# Patient Record
Sex: Female | Born: 1975 | Race: White | Hispanic: No | Marital: Married | Smoking: Former smoker
Health system: Southern US, Community
[De-identification: ages and names within clinical notes are randomized; demographics above are authoritative.]

## PROBLEM LIST (undated history)

## (undated) DIAGNOSIS — F419 Anxiety disorder, unspecified: Secondary | ICD-10-CM

## (undated) DIAGNOSIS — F329 Major depressive disorder, single episode, unspecified: Secondary | ICD-10-CM

## (undated) DIAGNOSIS — Z720 Tobacco use: Secondary | ICD-10-CM

## (undated) DIAGNOSIS — M549 Dorsalgia, unspecified: Secondary | ICD-10-CM

## (undated) DIAGNOSIS — F1510 Other stimulant abuse, uncomplicated: Secondary | ICD-10-CM

## (undated) DIAGNOSIS — F1120 Opioid dependence, uncomplicated: Secondary | ICD-10-CM

## (undated) DIAGNOSIS — K219 Gastro-esophageal reflux disease without esophagitis: Secondary | ICD-10-CM

## (undated) HISTORY — PX: LEG SURGERY: SHX1003

## (undated) HISTORY — PX: TUBAL LIGATION: SHX77

---

## 2009-06-05 ENCOUNTER — Emergency Department (HOSPITAL_COMMUNITY): Admission: EM | Admit: 2009-06-05 | Discharge: 2009-06-05 | Payer: Self-pay | Admitting: Emergency Medicine

## 2010-07-23 ENCOUNTER — Ambulatory Visit (HOSPITAL_COMMUNITY): Admission: RE | Admit: 2010-07-23 | Discharge: 2010-07-23 | Payer: Self-pay | Admitting: Obstetrics and Gynecology

## 2010-08-25 ENCOUNTER — Emergency Department (HOSPITAL_COMMUNITY): Admission: EM | Admit: 2010-08-25 | Discharge: 2010-08-26 | Payer: Self-pay | Admitting: Emergency Medicine

## 2011-01-16 LAB — CBC
HCT: 35.5 % — ABNORMAL LOW (ref 36.0–46.0)
MCV: 95.4 fL (ref 78.0–100.0)
RDW: 13.4 % (ref 11.5–15.5)
WBC: 9.4 10*3/uL (ref 4.0–10.5)

## 2011-01-16 LAB — POCT PREGNANCY, URINE: Preg Test, Ur: POSITIVE

## 2011-01-16 LAB — COMPREHENSIVE METABOLIC PANEL
Alkaline Phosphatase: 56 U/L (ref 39–117)
BUN: 6 mg/dL (ref 6–23)
GFR calc non Af Amer: >60 mL/min (ref >60)
Glucose, Bld: 93 mg/dL (ref 70–99)
Potassium: 3.8 mEq/L (ref 3.5–5.1)
Total Bilirubin: 0.6 mg/dL (ref 0.3–1.2)
Total Protein: 6.4 g/dL (ref 6.0–8.3)

## 2011-01-16 LAB — RAPID URINE DRUG SCREEN, HOSP PERFORMED
Barbiturates: NOT DETECTED
Benzodiazepines: POSITIVE — AB
Cocaine: NOT DETECTED

## 2011-01-16 LAB — DIFFERENTIAL
Basophils Absolute: 0 10*3/uL (ref 0.0–0.1)
Basophils Relative: 0 % (ref 0–1)
Monocytes Relative: 7 % (ref 3–12)
Neutro Abs: 6.6 10*3/uL (ref 1.7–7.7)
Neutrophils Relative %: 70 % (ref 43–77)

## 2011-10-17 DIAGNOSIS — F419 Anxiety disorder, unspecified: Secondary | ICD-10-CM | POA: Insufficient documentation

## 2011-12-29 ENCOUNTER — Emergency Department (HOSPITAL_BASED_OUTPATIENT_CLINIC_OR_DEPARTMENT_OTHER)
Admission: EM | Admit: 2011-12-29 | Discharge: 2011-12-29 | Disposition: A | Discharge status: Home / Self Care | Payer: Medicaid Other | Attending: Emergency Medicine | Admitting: Emergency Medicine

## 2011-12-29 ENCOUNTER — Encounter (HOSPITAL_BASED_OUTPATIENT_CLINIC_OR_DEPARTMENT_OTHER): Payer: Self-pay | Admitting: *Deleted

## 2011-12-29 DIAGNOSIS — Y93E1 Activity, personal bathing and showering: Secondary | ICD-10-CM | POA: Insufficient documentation

## 2011-12-29 DIAGNOSIS — F17200 Nicotine dependence, unspecified, uncomplicated: Secondary | ICD-10-CM | POA: Insufficient documentation

## 2011-12-29 DIAGNOSIS — S20229A Contusion of unspecified back wall of thorax, initial encounter: Secondary | ICD-10-CM | POA: Insufficient documentation

## 2011-12-29 DIAGNOSIS — K219 Gastro-esophageal reflux disease without esophagitis: Secondary | ICD-10-CM | POA: Insufficient documentation

## 2011-12-29 DIAGNOSIS — Y92009 Unspecified place in unspecified non-institutional (private) residence as the place of occurrence of the external cause: Secondary | ICD-10-CM | POA: Insufficient documentation

## 2011-12-29 DIAGNOSIS — M549 Dorsalgia, unspecified: Secondary | ICD-10-CM

## 2011-12-29 DIAGNOSIS — W010XXA Fall on same level from slipping, tripping and stumbling without subsequent striking against object, initial encounter: Secondary | ICD-10-CM | POA: Insufficient documentation

## 2011-12-29 DIAGNOSIS — T148XXA Other injury of unspecified body region, initial encounter: Secondary | ICD-10-CM

## 2011-12-29 DIAGNOSIS — S300XXA Contusion of lower back and pelvis, initial encounter: Secondary | ICD-10-CM | POA: Insufficient documentation

## 2011-12-29 HISTORY — DX: Gastro-esophageal reflux disease without esophagitis: K21.9

## 2011-12-29 HISTORY — DX: Anxiety disorder, unspecified: F41.9

## 2011-12-29 MED ORDER — HYDROCODONE-ACETAMINOPHEN 5-500 MG PO TABS: 1.0000 | ORAL_TABLET | Freq: Four times a day (QID) | ORAL | Status: AC | PRN

## 2011-12-29 NOTE — ED Provider Notes (Signed)
Medical screening examination/treatment/procedure(s) were performed by non-physician practitioner and as supervising physician I was immediately available for consultation/collaboration.  Gerhard Munch, MD 12/29/11 2227

## 2011-12-29 NOTE — Discharge Instructions (Signed)
Back Pain, Adult Low back pain is very common. About 1 in 5 people have back pain.The cause of low back pain is rarely dangerous. The pain often gets better over time.About half of people with a sudden onset of back pain feel better in just 2 weeks. About 8 in 10 people feel better by 6 weeks.  CAUSES Some common causes of back pain include:  Strain of the muscles or ligaments supporting the spine.   Wear and tear (degeneration) of the spinal discs.   Arthritis.   Direct injury to the back.  DIAGNOSIS Most of the time, the direct cause of low back pain is not known.However, back pain can be treated effectively even when the exact cause of the pain is unknown.Answering your caregiver's questions about your overall health and symptoms is one of the most accurate ways to make sure the cause of your pain is not dangerous. If your caregiver needs more information, he or she may order lab work or imaging tests (X-rays or MRIs).However, even if imaging tests show changes in your back, this usually does not require surgery. HOME CARE INSTRUCTIONS For many people, back pain returns.Since low back pain is rarely dangerous, it is often a condition that people can learn to manageon their own.   Remain active. It is stressful on the back to sit or stand in one place. Do not sit, drive, or stand in one place for more than 30 minutes at a time. Take short walks on level surfaces as soon as pain allows.Try to increase the length of time you walk each day.   Do not stay in bed.Resting more than 1 or 2 days can delay your recovery.   Do not avoid exercise or work.Your body is made to move.It is not dangerous to be active, even though your back may hurt.Your back will likely heal faster if you return to being active before your pain is gone.   Pay attention to your body when you bend and lift. Many people have less discomfortwhen lifting if they bend their knees, keep the load close to their  bodies,and avoid twisting. Often, the most comfortable positions are those that put less stress on your recovering back.   Find a comfortable position to sleep. Use a firm mattress and lie on your side with your knees slightly bent. If you lie on your back, put a pillow under your knees.   Only take over-the-counter or prescription medicines as directed by your caregiver. Over-the-counter medicines to reduce pain and inflammation are often the most helpful.Your caregiver may prescribe muscle relaxant drugs.These medicines help dull your pain so you can more quickly return to your normal activities and healthy exercise.   Put ice on the injured area.   Put ice in a plastic bag.   Place a towel between your skin and the bag.   Leave the ice on for 15 to 20 minutes, 3 to 4 times a day for the first 2 to 3 days. After that, ice and heat may be alternated to reduce pain and spasms.   Ask your caregiver about trying back exercises and gentle massage. This may be of some benefit.   Avoid feeling anxious or stressed.Stress increases muscle tension and can worsen back pain.It is important to recognize when you are anxious or stressed and learn ways to manage it.Exercise is a great option.  SEEK MEDICAL CARE IF:  You have pain that is not relieved with rest or medicine.   You have   pain that does not improve in 1 week.   You have new symptoms.   You are generally not feeling well.  SEEK IMMEDIATE MEDICAL CARE IF:   You have pain that radiates from your back into your legs.   You develop new bowel or bladder control problems.   You have unusual weakness or numbness in your arms or legs.   You develop nausea or vomiting.   You develop abdominal pain.   You feel faint.  Document Released: 10/21/2005 Document Revised: 07/03/2011 Document Reviewed: 03/11/2011 [facility] Patient Information 2012 [place], [place].Contusion A contusion is a deep bruise. Bruises happen when an injury causes  bleeding under the skin. Signs of bruising include pain, puffiness (swelling), and discolored skin. The bruise may turn blue, purple, or yellow. HOME CARE   Rest the injured area until the pain and puffiness are better.   Try to limit use of the injured area as much as possible or as told by your doctor.   Put ice on the injured area.   Put ice in a plastic bag.   Place a towel between your skin and the bag.   Leave the ice on for 15 to 20 minutes, 3 to 4 times a day.   Raise (elevate) the injured area above the level of the heart.   Use an elastic bandage to lessen puffiness and motion.   Only take medicine as told by your doctor.   Eat healthy.   See your doctor for a follow-up visit.  GET HELP RIGHT AWAY IF:   There is more redness, puffiness, or pain.   You have a headache, muscle ache, or you feel dizzy and ill.   You have a fever.   The pain is not controlled with medicine.   The bruise is not getting better.   There is yellowish white fluid (pus) coming from the wound.   You lose feeling (numbness) in the injured area.   The bruised area feels cold.   There are new problems.  MAKE SURE YOU:   Understand these instructions.   Will watch your condition.   Will get help right away if you are not doing well or get worse.  Document Released: 04/08/2008 Document Revised: 07/03/2011 Document Reviewed: 04/08/2008 [facility] Patient Information 2012 [place], [place].

## 2011-12-29 NOTE — ED Notes (Signed)
Pt presents to ED today with back pain for over a year after jumping on trampoline.  Pt has a significant bruise to right buttocks that she noticed 3 days ago

## 2011-12-29 NOTE — ED Provider Notes (Signed)
History     CSN: [id number]  Arrival date & time 12/29/11  1911   First MD Initiated Contact with Patient 12/29/11 2016      Chief Complaint  Patient presents with  . Back Pain    (Consider location/radiation/quality/duration/timing/severity/associated sxs/prior treatment) HPI Comments: Pt states that she has been being treated by her pcp for back pain over the last year:pt states that she fell in the shower a couple of days ago and now she has bruising to her right buttock and she is more sore then normal:pt states that she ran out of her pain medication this morning  Patient is a 36 y.o. female presenting with back pain. The history is provided by the patient. No language interpreter was used.  Back Pain  This is a chronic problem. The current episode started more than 1 week ago. The problem occurs constantly. The problem has been gradually worsening. The pain is associated with falling. The pain is present in the gluteal region. The quality of the pain is described as aching. The pain does not radiate. The pain is moderate. The symptoms are aggravated by twisting and bending. The pain is the same all the time. Pertinent negatives include no numbness, no bowel incontinence, no perianal numbness, no tingling and no weakness.    Past Medical History  Diagnosis Date  . GERD (gastroesophageal reflux disease)   . Anxiety     History reviewed. No pertinent past surgical history.  History reviewed. No pertinent family history.  History  Substance Use Topics  . Smoking status: Current Everyday Smoker -- 1.0 packs/day  . Smokeless tobacco: Not on file  . Alcohol Use: No    OB History    Grav Para Term Preterm Abortions TAB SAB Ect Mult Living                  Review of Systems  Gastrointestinal: Negative for bowel incontinence.  Musculoskeletal: Positive for back pain.  Neurological: Negative for tingling, weakness and numbness.  All other systems reviewed and are  negative.    Allergies  Ultram  Home Medications   Current Outpatient Rx  Name Route Sig Dispense Refill  . CLONAZEPAM 1 MG PO TABS Oral Take 1 mg by mouth 2 (two) times daily as needed. For anxiety    . HYDROCODONE-ACETAMINOPHEN 7.5-325 MG PO TABS Oral Take 1 tablet by mouth every 6 (six) hours as needed. For pain    . IBUPROFEN 200 MG PO TABS Oral Take 400 mg by mouth every 6 (six) hours as needed. For pain    . OMEPRAZOLE 20 MG PO CPDR Oral Take 40 mg by mouth daily.    Marland Kitchen HYDROCODONE-ACETAMINOPHEN 5-500 MG PO TABS Oral Take 1-2 tablets by mouth every 6 (six) hours as needed for pain. 15 tablet 0    BP 138/79  Pulse 97  Temp(Src) 98.3 F (36.8 C) (Oral)  Resp 20  SpO2 100%  Physical Exam  Nursing note and vitals reviewed. Constitutional: She is oriented to person, place, and time. She appears well-developed and well-nourished.  HENT:  Head: Normocephalic and atraumatic.  Eyes: EOM are normal.  Neck: Neck supple.  Cardiovascular: Normal rate and regular rhythm.   Pulmonary/Chest: Effort normal and breath sounds normal.  Musculoskeletal: Normal range of motion.       Cervical back: Normal.       Thoracic back: Normal.       Lumbar back: Normal. She exhibits no bony tenderness, no edema and no  deformity.  Neurological: She is alert and oriented to person, place, and time.  Skin:       Pt has large bruise noted to the right buttock    ED Course  Procedures (including critical care time)  Labs Reviewed - No data to display No results found.   1. Contusion   2. Back pain       MDM  Pt not having any neuro deficit:will treat symptomatically with something for pain:don't think imaging is needed at this time       Teressa Lower, NP 12/29/11 2047

## 2012-01-13 ENCOUNTER — Encounter (HOSPITAL_BASED_OUTPATIENT_CLINIC_OR_DEPARTMENT_OTHER): Payer: Self-pay | Admitting: Family Medicine

## 2012-01-13 ENCOUNTER — Emergency Department (HOSPITAL_BASED_OUTPATIENT_CLINIC_OR_DEPARTMENT_OTHER)
Admission: EM | Admit: 2012-01-13 | Discharge: 2012-01-13 | Disposition: A | Discharge status: Home Health | Payer: Medicaid Other | Attending: Emergency Medicine | Admitting: Emergency Medicine

## 2012-01-13 DIAGNOSIS — F411 Generalized anxiety disorder: Secondary | ICD-10-CM | POA: Insufficient documentation

## 2012-01-13 DIAGNOSIS — M545 Low back pain, unspecified: Secondary | ICD-10-CM | POA: Insufficient documentation

## 2012-01-13 DIAGNOSIS — K219 Gastro-esophageal reflux disease without esophagitis: Secondary | ICD-10-CM | POA: Insufficient documentation

## 2012-01-13 DIAGNOSIS — F17200 Nicotine dependence, unspecified, uncomplicated: Secondary | ICD-10-CM | POA: Insufficient documentation

## 2012-01-13 DIAGNOSIS — M549 Dorsalgia, unspecified: Secondary | ICD-10-CM

## 2012-01-13 HISTORY — DX: Dorsalgia, unspecified: M54.9

## 2012-01-13 MED ORDER — HYDROCODONE-ACETAMINOPHEN 5-325 MG PO TABS: ORAL_TABLET | ORAL | Status: AC

## 2012-01-13 NOTE — ED Notes (Signed)
Pt c/o low back pain chronic in nature and "flared up" post fall 2 wks ago. Pt sts her PCP has changed and [place]. Pt sts ibuprofen is not helping.

## 2012-01-13 NOTE — ED Provider Notes (Signed)
History     CSN: [id number]  Arrival date & time 01/13/12  4098   First MD Initiated Contact with Patient 01/13/12 [phone]      Chief Complaint  Patient presents with  . Back Pain    (Consider location/radiation/quality/duration/timing/severity/associated sxs/prior treatment) HPI Comments: Patient c/o increasing pain to her lower back for 2 weeks.  States she has hx of chronic low back pain and recently ran  Out of her pain medication.  States she contacted her PMD but he is out of the office.  States she has tried taking ibuprofen w/o improvement of the pain.  She denies perineal numbness, incontinence, or dysuria.    Patient is a 36 y.o. female presenting with back pain. The history is provided by the patient. No language interpreter was used.  Back Pain  This is a chronic problem. The current episode started more than 1 week ago. The problem occurs constantly. The problem has been gradually worsening. The pain is associated with twisting and falling. The pain is present in the lumbar spine. The quality of the pain is described as aching. The pain does not radiate. The pain is mild. The symptoms are aggravated by bending, twisting and certain positions. The pain is the same all the time. Pertinent negatives include no chest pain, no fever, no numbness, no abdominal pain, no abdominal swelling, no bowel incontinence, no perianal numbness, no bladder incontinence, no dysuria, no pelvic pain, no leg pain, no paresthesias, no paresis, no tingling and no weakness. She has tried NSAIDs for the symptoms. The treatment provided no relief.    Past Medical History  Diagnosis Date  . GERD (gastroesophageal reflux disease)   . Anxiety   . Back pain     History reviewed. No pertinent past surgical history.  History reviewed. No pertinent family history.  History  Substance Use Topics  . Smoking status: Current Everyday Smoker -- 1.0 packs/day  . Smokeless tobacco: Not on file  . Alcohol Use:  No    OB History    Grav Para Term Preterm Abortions TAB SAB Ect Mult Living                  Review of Systems  Constitutional: Negative for fever.  Cardiovascular: Negative for chest pain.  Gastrointestinal: Negative for abdominal pain and bowel incontinence.  Genitourinary: Negative for bladder incontinence, dysuria, hematuria, decreased urine volume, difficulty urinating, vaginal pain and pelvic pain.  Musculoskeletal: Positive for back pain.  Skin: Negative.   Neurological: Negative for tingling, weakness, numbness and paresthesias.  All other systems reviewed and are negative.    Allergies  Ultram  Home Medications   Current Outpatient Rx  Name Route Sig Dispense Refill  . CLONAZEPAM 1 MG PO TABS Oral Take 1 mg by mouth 2 (two) times daily as needed. For anxiety    . HYDROCODONE-ACETAMINOPHEN 7.5-325 MG PO TABS Oral Take 1 tablet by mouth every 6 (six) hours as needed. For pain    . IBUPROFEN 200 MG PO TABS Oral Take 400 mg by mouth every 6 (six) hours as needed. For pain    . OMEPRAZOLE 20 MG PO CPDR Oral Take 40 mg by mouth daily.      BP 139/81  Pulse 88  Temp(Src) 98 F (36.7 C) (Oral)  Resp 20  Ht 5\' 4"  (1.626 m)  Wt 180 lb (81.647 kg)  BMI 30.90 kg/m2  SpO2 100%  Physical Exam  Nursing note and vitals reviewed. Constitutional: She is oriented  to person, place, and time. She appears well-developed and well-nourished. No distress.  HENT:  Head: Normocephalic and atraumatic.  Neck: Normal range of motion. Neck supple.  Cardiovascular: Normal rate, regular rhythm, normal heart sounds and intact distal pulses.   No murmur heard. Pulmonary/Chest: Effort normal and breath sounds normal. No respiratory distress.  Musculoskeletal: Normal range of motion. She exhibits no edema and no tenderness.       Lumbar back: She exhibits tenderness. She exhibits normal range of motion, no bony tenderness, no swelling, no edema, no deformity, no spasm and normal pulse.        Back:  Lymphadenopathy:    She has no cervical adenopathy.  Neurological: She is alert and oriented to person, place, and time. No sensory deficit. She exhibits normal muscle tone. Coordination and gait normal.  Reflex Scores:      Patellar reflexes are 2+ on the right side and 2+ on the left side.      Achilles reflexes are 2+ on the right side and 2+ on the left side. Skin: Skin is warm and dry.    ED Course  Procedures (including critical care time)       MDM   Patient has tenderness to palpation of the lumbar paraspinal muscles. She ambulates with a steady gait. No focal neuro deficits on exam. She bent over several times in the exam room to pick up her child's toys and ambulated to the restroom w/o difficulty   She was seen here on 2/24 13 for similar symptoms.  She has an appointment on the 18th with her primary care physician have advised her to keep her followup appointment.  Patient / Family / Caregiver understand and agree with initial ED impression and plan with expectations set for ED visit. Pt stable in ED with no significant deterioration in condition. Pt feels improved after observation and/or treatment in ED.        Ardel Jagger L. Hayslee Casebolt, [place] 01/14/12 1246

## 2012-01-13 NOTE — Discharge Instructions (Signed)
Back Pain, Adult Low back pain is very common. About 1 in 5 people have back pain.The cause of low back pain is rarely dangerous. The pain often gets better over time.About half of people with a sudden onset of back pain feel better in just 2 weeks. About 8 in 10 people feel better by 6 weeks.  CAUSES Some common causes of back pain include:  Strain of the muscles or ligaments supporting the spine.   Wear and tear (degeneration) of the spinal discs.   Arthritis.   Direct injury to the back.  DIAGNOSIS Most of the time, the direct cause of low back pain is not known.However, back pain can be treated effectively even when the exact cause of the pain is unknown.Answering your caregiver's questions about your overall health and symptoms is one of the most accurate ways to make sure the cause of your pain is not dangerous. If your caregiver needs more information, he or she may order lab work or imaging tests (X-rays or MRIs).However, even if imaging tests show changes in your back, this usually does not require surgery. HOME CARE INSTRUCTIONS For many people, back pain returns.Since low back pain is rarely dangerous, it is often a condition that people can learn to manageon their own.   Remain active. It is stressful on the back to sit or stand in one place. Do not sit, drive, or stand in one place for more than 30 minutes at a time. Take short walks on level surfaces as soon as pain allows.Try to increase the length of time you walk each day.   Do not stay in bed.Resting more than 1 or 2 days can delay your recovery.   Do not avoid exercise or work.Your body is made to move.It is not dangerous to be active, even though your back may hurt.Your back will likely heal faster if you return to being active before your pain is gone.   Pay attention to your body when you bend and lift. Many people have less discomfortwhen lifting if they bend their knees, keep the load close to their  bodies,and avoid twisting. Often, the most comfortable positions are those that put less stress on your recovering back.   Find a comfortable position to sleep. Use a firm mattress and lie on your side with your knees slightly bent. If you lie on your back, put a pillow under your knees.   Only take over-the-counter or prescription medicines as directed by your caregiver. Over-the-counter medicines to reduce pain and inflammation are often the most helpful.Your caregiver may prescribe muscle relaxant drugs.These medicines help dull your pain so you can more quickly return to your normal activities and healthy exercise.   Put ice on the injured area.   Put ice in a plastic bag.   Place a towel between your skin and the bag.   Leave the ice on for 15 to 20 minutes, 3 to 4 times a day for the first 2 to 3 days. After that, ice and heat may be alternated to reduce pain and spasms.   Ask your caregiver about trying back exercises and gentle massage. This may be of some benefit.   Avoid feeling anxious or stressed.Stress increases muscle tension and can worsen back pain.It is important to recognize when you are anxious or stressed and learn ways to manage it.Exercise is a great option.  SEEK MEDICAL CARE IF:  You have pain that is not relieved with rest or medicine.   You have   pain that does not improve in 1 week.   You have new symptoms.   You are generally not feeling well.  SEEK IMMEDIATE MEDICAL CARE IF:   You have pain that radiates from your back into your legs.   You develop new bowel or bladder control problems.   You have unusual weakness or numbness in your arms or legs.   You develop nausea or vomiting.   You develop abdominal pain.   You feel faint.  Document Released: 10/21/2005 Document Revised: 10/10/2011 Document Reviewed: 03/11/2011 [employer] Patient Information 2012 [employer]. 

## 2012-01-14 NOTE — ED Provider Notes (Signed)
Medical screening examination/treatment/procedure(s) were performed by non-physician practitioner and as supervising physician I was immediately available for consultation/collaboration.  Shelda Jakes, MD 01/14/12 2248

## 2012-01-17 ENCOUNTER — Emergency Department (HOSPITAL_BASED_OUTPATIENT_CLINIC_OR_DEPARTMENT_OTHER)
Admission: EM | Admit: 2012-01-17 | Discharge: 2012-01-17 | Disposition: A | Discharge status: Home / Self Care | Payer: Medicaid Other | Attending: Emergency Medicine | Admitting: Emergency Medicine

## 2012-01-17 ENCOUNTER — Encounter (HOSPITAL_BASED_OUTPATIENT_CLINIC_OR_DEPARTMENT_OTHER): Payer: Self-pay | Admitting: Emergency Medicine

## 2012-01-17 DIAGNOSIS — K219 Gastro-esophageal reflux disease without esophagitis: Secondary | ICD-10-CM | POA: Insufficient documentation

## 2012-01-17 DIAGNOSIS — F411 Generalized anxiety disorder: Secondary | ICD-10-CM | POA: Insufficient documentation

## 2012-01-17 DIAGNOSIS — F17200 Nicotine dependence, unspecified, uncomplicated: Secondary | ICD-10-CM | POA: Insufficient documentation

## 2012-01-17 DIAGNOSIS — M549 Dorsalgia, unspecified: Secondary | ICD-10-CM | POA: Insufficient documentation

## 2012-01-17 MED ORDER — HYDROCODONE-ACETAMINOPHEN 5-500 MG PO TABS: 1.0000 | ORAL_TABLET | Freq: Four times a day (QID) | ORAL | Status: AC | PRN

## 2012-01-17 NOTE — Discharge Instructions (Signed)
Back Exercises Back exercises help treat and prevent back injuries. The goal of back exercises is to increase the strength of your abdominal and back muscles and the flexibility of your back. These exercises should be started when you no longer have back pain. Back exercises include:  Pelvic Tilt. Lie on your back with your knees bent. Tilt your pelvis until the lower part of your back is against the floor. Hold this position 5 to 10 sec and repeat 5 to 10 times.   Knee to Chest. Pull first 1 knee up against your chest and hold for 20 to 30 seconds, repeat this with the other knee, and then both knees. This may be done with the other leg straight or bent, whichever feels better.   Sit-Ups or Curl-Ups. Bend your knees 90 degrees. Start with tilting your pelvis, and do a partial, slow sit-up, lifting your trunk only 30 to 45 degrees off the floor. Take at least 2 to 3 seconds for each sit-up. Do not do sit-ups with your knees out straight. If partial sit-ups are difficult, simply do the above but with only tightening your abdominal muscles and holding it as directed.   Hip-Lift. Lie on your back with your knees flexed 90 degrees. Push down with your feet and shoulders as you raise your hips a couple inches off the floor; hold for 10 seconds, repeat 5 to 10 times.   Back arches. Lie on your stomach, propping yourself up on bent elbows. Slowly press on your hands, causing an arch in your low back. Repeat 3 to 5 times. Any initial stiffness and discomfort should lessen with repetition over time.   Shoulder-Lifts. Lie face down with arms beside your body. Keep hips and torso pressed to floor as you slowly lift your head and shoulders off the floor.  Do not overdo your exercises, especially in the beginning. Exercises may cause you some mild back discomfort which lasts for a few minutes; however, if the pain is more severe, or lasts for more than 15 minutes, do not continue exercises until you see your  caregiver. Improvement with exercise therapy for back problems is slow.  See your caregivers for assistance with developing a proper back exercise program. Document Released: 11/28/2004 Document Revised: 10/10/2011 Document Reviewed: 10/21/2005 [employer] Patient Information 2012 [employer]. 

## 2012-01-17 NOTE — ED Notes (Signed)
Pt. States that she has past injury and chronic low back pain. She states she fell in the bath tub 3 weeks ago and has been worse since.

## 2012-01-17 NOTE — ED Provider Notes (Signed)
History     CSN: [id number]  Arrival date & time 01/17/12  7829   First MD Initiated Contact with Patient 01/17/12 [phone]      Chief Complaint  Patient presents with  . Back Pain    (Consider location/radiation/quality/duration/timing/severity/associated sxs/prior treatment) HPI Comments: She has had issues with her medicaid and can't get in to new doctor for a couple of weeks.  Patient is a 36 y.o. female presenting with back pain. The history is provided by the patient.  Back Pain  This is a chronic problem. The problem occurs constantly. The problem has not changed since onset.The pain is associated with no known injury. The pain is present in the lumbar spine. The quality of the pain is described as stabbing. The pain does not radiate. The pain is moderate.    Past Medical History  Diagnosis Date  . GERD (gastroesophageal reflux disease)   . Anxiety   . Back pain     Past Surgical History  Procedure Date  . Leg surgery     No family history on file.  History  Substance Use Topics  . Smoking status: Current Everyday Smoker -- 1.0 packs/day  . Smokeless tobacco: Not on file  . Alcohol Use: No    OB History    Grav Para Term Preterm Abortions TAB SAB Ect Mult Living                  Review of Systems  Musculoskeletal: Positive for back pain.  All other systems reviewed and are negative.    Allergies  Ultram  Home Medications   Current Outpatient Rx  Name Route Sig Dispense Refill  . CLONAZEPAM 1 MG PO TABS Oral Take 1 mg by mouth 2 (two) times daily as needed. For anxiety    . HYDROCODONE-ACETAMINOPHEN 5-325 MG PO TABS  Take one-two tabs po q 4-6 hrs prn pain 15 tablet 0  . HYDROCODONE-ACETAMINOPHEN 7.5-325 MG PO TABS Oral Take 1 tablet by mouth every 6 (six) hours as needed. For pain    . IBUPROFEN 200 MG PO TABS Oral Take 400 mg by mouth every 6 (six) hours as needed. For pain    . OMEPRAZOLE 20 MG PO CPDR Oral Take 40 mg by mouth daily.      BP  148/96  Pulse 105  Resp 18  SpO2 98%  LMP 01/17/2012  Physical Exam  Nursing note and vitals reviewed. Constitutional: She is oriented to person, place, and time. She appears well-developed and well-nourished. No distress.  HENT:  Head: Normocephalic and atraumatic.  Neck: Normal range of motion. Neck supple.  Abdominal: Soft. Bowel sounds are normal.  Musculoskeletal: Normal range of motion.       ttp in the soft tissues of the lower back.    Neurological: She is alert and oriented to person, place, and time. She displays normal reflexes. Coordination normal.  Skin: Skin is warm and dry. She is not diaphoretic.    ED Course  Procedures (including critical care time)  Labs Reviewed - No data to display No results found.   No diagnosis found.    MDM  She is complaining of back pain but appears to be no discomfort.  She is out of her hydrocodone.  She was able to bend over while holding her child and pick up a tissue off the floor without any limitation.  She needs a pcp to manage both her pain and the cause of the pain.  I informed  her that I will not prescribe further narcotics for her.  She understands this.        Geoffery Lyons, MD 01/17/12 [phone]

## 2012-02-02 ENCOUNTER — Emergency Department (HOSPITAL_BASED_OUTPATIENT_CLINIC_OR_DEPARTMENT_OTHER)
Admission: EM | Admit: 2012-02-02 | Discharge: 2012-02-02 | Disposition: A | Discharge status: Home / Self Care | Payer: Medicaid Other | Attending: Emergency Medicine | Admitting: Emergency Medicine

## 2012-02-02 ENCOUNTER — Encounter (HOSPITAL_BASED_OUTPATIENT_CLINIC_OR_DEPARTMENT_OTHER): Payer: Self-pay | Admitting: *Deleted

## 2012-02-02 DIAGNOSIS — Z760 Encounter for issue of repeat prescription: Secondary | ICD-10-CM | POA: Insufficient documentation

## 2012-02-02 DIAGNOSIS — M549 Dorsalgia, unspecified: Secondary | ICD-10-CM | POA: Insufficient documentation

## 2012-02-02 DIAGNOSIS — F17200 Nicotine dependence, unspecified, uncomplicated: Secondary | ICD-10-CM | POA: Insufficient documentation

## 2012-02-02 DIAGNOSIS — K219 Gastro-esophageal reflux disease without esophagitis: Secondary | ICD-10-CM | POA: Insufficient documentation

## 2012-02-02 DIAGNOSIS — G8929 Other chronic pain: Secondary | ICD-10-CM

## 2012-02-02 NOTE — ED Provider Notes (Signed)
Medical screening examination/treatment/procedure(s) were performed by non-physician practitioner and as supervising physician I was immediately available for consultation/collaboration.    Celene Kras, MD 02/02/12 [phone]

## 2012-02-02 NOTE — Discharge Instructions (Signed)
Chronic Back Pain When back pain lasts longer than 3 months, it is called chronic back pain.This pain can be frustrating, but the cause of the pain is rarely dangerous.People with chronic back pain often go through certain periods that are more intense (flare-ups). CAUSES Chronic back pain can be caused by wear and tear (degeneration) on different structures in your back. These structures may include bones, ligaments, or discs. This degeneration may result in more pressure being placed on the nerves that travel to your legs and feet. This can lead to pain traveling from the low back down the back of the legs. When pain lasts longer than 3 months, it is not unusual for people to experience anxiety or depression. Anxiety and depression can also contribute to low back pain. TREATMENT  Establish a regular exercise plan. This is critical to improving your functional level.   Have a self-management plan for when you flare-up. Flare-ups rarely require a medical visit. Regular exercise will help reduce the intensity and frequency of your flare-ups.   Manage how you feel about your back pain and the rest of your life. Anxiety, depression, and feeling that you cannot alter your back pain have been shown to make back pain more intense and debilitating.   Medicines should never be your only treatment. They should be used along with other treatments to help you return to a more active lifestyle.   Procedures such as injections or surgery may be helpful but are rarely necessary. You may be able to get the same results with physical therapy or chiropractic care.  HOME CARE INSTRUCTIONS  Avoid bending, heavy lifting, prolonged sitting, and activities which make the problem worse.   Continue normal activity as much as possible.   Take brief periods of rest throughout the day to reduce your pain during flare-ups.   Follow your back exercise rehabilitation program. This can help reduce symptoms and prevent  more pain.   Only take over-the-counter or prescription medicines as directed by your caregiver. Muscle relaxants are sometimes prescribed. Narcotic pain medicine is discouraged for long-term pain, since addiction is a possible outcome.   If you smoke, quit.   Eat healthy foods and maintain a recommended body weight.  SEEK IMMEDIATE MEDICAL CARE IF:   You have weakness or numbness in one of your legs or feet.   You have trouble controlling your bladder or bowels.   You develop nausea, vomiting, abdominal pain, shortness of breath, or fainting.  Document Released: 11/28/2004 Document Revised: 10/10/2011 Document Reviewed: 10/05/2011 Exit[employer] Patient Information 2012 [employer]. 

## 2012-02-02 NOTE — ED Notes (Signed)
Pt states she has a hx of back pain and ran out of her meds. Requesting refill of Vicodin until the 5th.

## 2012-02-02 NOTE — ED Provider Notes (Signed)
History     CSN: [id number]  Arrival date & time 02/02/12  2017   First MD Initiated Contact with Patient 02/02/12 2206      Chief Complaint  Patient presents with  . Medication Refill    (Consider location/radiation/quality/duration/timing/severity/associated sxs/prior treatment) Patient is a 36 y.o. female presenting with back pain. The history is provided by the patient. No language interpreter was used.  Back Pain  This is a new problem. The problem occurs constantly. The pain is present in the lumbar spine. The quality of the pain is described as aching. The pain is at a severity of 7/10. The pain is moderate. The symptoms are aggravated by bending and twisting. She has tried NSAIDs for the symptoms. The treatment provided no relief.   Pt is requesting hydrocodone.  Pt has been seen here x3 previously for the same.  Pt was told by Dr. Judd Lien on last visit that we would not give further narcotics Past Medical History  Diagnosis Date  . GERD (gastroesophageal reflux disease)   . Anxiety   . Back pain     Past Surgical History  Procedure Date  . Leg surgery     History reviewed. No pertinent family history.  History  Substance Use Topics  . Smoking status: Current Everyday Smoker -- 1.0 packs/day  . Smokeless tobacco: Not on file  . Alcohol Use: No    OB History    Grav Para Term Preterm Abortions TAB SAB Ect Mult Living                  Review of Systems  Musculoskeletal: Positive for back pain.  All other systems reviewed and are negative.    Allergies  Ultram  Home Medications   Current Outpatient Rx  Name Route Sig Dispense Refill  . CLONAZEPAM 1 MG PO TABS Oral Take 1 mg by mouth 2 (two) times daily as needed. For anxiety    . HYDROCODONE-ACETAMINOPHEN 5-325 MG PO TABS Oral Take 1-2 tablets by mouth every 6 (six) hours as needed. For pain    . IBUPROFEN 200 MG PO TABS Oral Take 400 mg by mouth every 6 (six) hours as needed. For pain    . OMEPRAZOLE  20 MG PO CPDR Oral Take 20 mg by mouth 2 (two) times daily.       BP 117/85  Pulse 97  Temp(Src) 98.8 F (37.1 C) (Oral)  Resp 20  Ht 5\' 4"  (1.626 m)  Wt 179 lb (81.194 kg)  BMI 30.73 kg/m2  SpO2 99%  LMP 01/17/2012  Physical Exam  Nursing note and vitals reviewed. Constitutional: She appears well-developed and well-nourished.  HENT:  Head: Normocephalic.  Cardiovascular: Normal rate.   Pulmonary/Chest: Effort normal.  Abdominal: Soft.  Musculoskeletal: She exhibits tenderness.       Tender ls spine  Neurological: She is alert.    ED Course  Procedures (including critical care time)  Labs Reviewed - No data to display No results found.   No diagnosis found.    MDM  Pt advised that her MD will need to mange narcotics in the future.  Pt is currently receiving klopin from her MD.  Pt advised to take her ibuprofen       Lonia Skinner [place], [place] 02/02/12 2246

## 2014-01-15 ENCOUNTER — Encounter (HOSPITAL_BASED_OUTPATIENT_CLINIC_OR_DEPARTMENT_OTHER): Payer: Self-pay | Admitting: Emergency Medicine

## 2014-01-15 ENCOUNTER — Emergency Department (HOSPITAL_BASED_OUTPATIENT_CLINIC_OR_DEPARTMENT_OTHER)
Admission: EM | Admit: 2014-01-15 | Discharge: 2014-01-15 | Disposition: A | Discharge status: Home / Self Care | Payer: Medicaid Other | Attending: Emergency Medicine | Admitting: Emergency Medicine

## 2014-01-15 DIAGNOSIS — Z79899 Other long term (current) drug therapy: Secondary | ICD-10-CM | POA: Insufficient documentation

## 2014-01-15 DIAGNOSIS — K219 Gastro-esophageal reflux disease without esophagitis: Secondary | ICD-10-CM | POA: Insufficient documentation

## 2014-01-15 DIAGNOSIS — K029 Dental caries, unspecified: Secondary | ICD-10-CM

## 2014-01-15 DIAGNOSIS — F411 Generalized anxiety disorder: Secondary | ICD-10-CM | POA: Insufficient documentation

## 2014-01-15 DIAGNOSIS — F17200 Nicotine dependence, unspecified, uncomplicated: Secondary | ICD-10-CM | POA: Insufficient documentation

## 2014-01-15 MED ORDER — HYDROCODONE-ACETAMINOPHEN 5-325 MG PO TABS: 1.0000 | ORAL_TABLET | ORAL | Status: DC | PRN

## 2014-01-15 NOTE — ED Provider Notes (Signed)
CSN: [id number]     Arrival date & time 01/15/14  1316 History   First MD Initiated Contact with Patient 01/15/14 1358     Chief Complaint  Patient presents with  . Dental Pain     (Consider location/radiation/quality/duration/timing/severity/associated sxs/prior Treatment) Patient is a 38 y.o. female presenting with tooth pain. The history is provided by the patient.  Dental Pain Location:  Upper Upper teeth location:  12/LU 1st bicuspid Quality:  Throbbing and constant Severity:  Severe Progression:  Unchanged Chronicity:  New Context: dental caries and recent dental surgery   Prior workup: tooth extracted. Worsened by:  Pressure Ineffective treatments:  Acetaminophen and NSAIDs Associated symptoms: facial pain    Christina Stokes is a 38 y.o. female who presents to the ED with dental pain. She had a tooth extracted 3 days ago and was started on antibiotics, ibuprofen and given a few narcotic pain pills. She is out of her Norco and the dental office is not open until Tuesday.   Past Medical History  Diagnosis Date  . GERD (gastroesophageal reflux disease)   . Anxiety   . Back pain    Past Surgical History  Procedure Laterality Date  . Leg surgery     No family history on file. History  Substance Use Topics  . Smoking status: Current Every Day Smoker -- 1.00 packs/day  . Smokeless tobacco: Not on file  . Alcohol Use: No   OB History   Grav Para Term Preterm Abortions TAB SAB Ect Mult Living                 Review of Systems Negative except as stated in HPI   Allergies  Ultram  Home Medications   Current Outpatient Rx  Name  Route  Sig  Dispense  Refill  . amoxicillin (AMOXIL) 500 MG capsule   Oral   Take 500 mg by mouth 3 (three) times daily.         . varenicline (CHANTIX) 1 MG tablet   Oral   Take 1 mg by mouth 2 (two) times daily.         . clonazePAM (KLONOPIN) 1 MG tablet   Oral   Take 1 mg by mouth 2 (two) times daily as needed. For  anxiety         . HYDROcodone-acetaminophen (NORCO) 5-325 MG per tablet   Oral   Take 1-2 tablets by mouth every 6 (six) hours as needed. For pain         . ibuprofen (ADVIL,MOTRIN) 200 MG tablet   Oral   Take 400 mg by mouth every 6 (six) hours as needed. For pain         . omeprazole (PRILOSEC) 20 MG capsule   Oral   Take 20 mg by mouth 2 (two) times daily.           BP 122/71  Pulse 61  Temp(Src) 98.8 F (37.1 C) (Oral)  Resp 18  Ht 5\' 4"  (1.626 m)  Wt 201 lb (91.173 kg)  BMI 34.48 kg/m2  SpO2 99%  LMP 01/08/2014 Physical Exam  Nursing note and vitals reviewed. Constitutional: She is oriented to person, place, and time. She appears well-developed and well-nourished. No distress.  HENT:  Head: Normocephalic.  Right Ear: Tympanic membrane normal.  Left Ear: Tympanic membrane normal.  Nose: Nose normal.  Mouth/Throat: Uvula is midline, oropharynx is clear and moist and mucous membranes are normal.    There are multiple dental caries and  the area where the tooth was extracted has minimal drainage. There is ttenderness of the gum surrounding the tooth.   Eyes: Conjunctivae and EOM are normal.  Neck: Neck supple.  Pulmonary/Chest: Effort normal.  Abdominal: Soft. There is no tenderness.  Musculoskeletal: Normal range of motion.  Neurological: She is alert and oriented to person, place, and time. No cranial nerve deficit.  Skin: Skin is warm and dry.  Psychiatric: She has a normal mood and affect. Her behavior is normal.    ED Course  Procedures  MDM  38 y.o. female with dental pain due to recent surgery and dental caries. Will treat for pain and she will continue her antibiotics. She will follow up with her dentist on Monday. She will return here as needed for problems. Stable for discharge. She does not appear toxic.  Discussed with the patient and all questioned fully answered.    Medication List    ASK your doctor about these medications        amoxicillin 500 MG capsule  Commonly known as:  AMOXIL  Take 500 mg by mouth 3 (three) times daily.     clonazePAM 1 MG tablet  Commonly known as:  KLONOPIN  Take 1 mg by mouth 2 (two) times daily as needed. For anxiety     HYDROcodone-acetaminophen 5-325 MG per tablet  Commonly known as:  NORCO/VICODIN  Take 1-2 tablets by mouth every 6 (six) hours as needed. For pain  Ask about: Which instructions should I use?     HYDROcodone-acetaminophen 5-325 MG per tablet  Commonly known as:  NORCO/VICODIN  Take 1 tablet by mouth every 4 (four) hours as needed.  Ask about: Which instructions should I use?     ibuprofen 200 MG tablet  Commonly known as:  ADVIL,MOTRIN  Take 400 mg by mouth every 6 (six) hours as needed. For pain     omeprazole 20 MG capsule  Commonly known as:  PRILOSEC  Take 20 mg by mouth 2 (two) times daily.     varenicline 1 MG tablet  Commonly known as:  CHANTIX  Take 1 mg by mouth 2 (two) times daily.            [facility] Orlene Och, [place] 01/15/14 [phone]

## 2014-01-15 NOTE — Discharge Instructions (Signed)
Follow up with your dentist as soon as possible.   Dental Pain A tooth ache may be caused by cavities (tooth decay). Cavities expose the nerve of the tooth to air and hot or cold temperatures. It may come from an infection or abscess (also called a boil or furuncle) around your tooth. It is also often caused by dental caries (tooth decay). This causes the pain you are having. DIAGNOSIS  Your caregiver can diagnose this problem by exam. TREATMENT   If caused by an infection, it may be treated with medications which kill germs (antibiotics) and pain medications as prescribed by your caregiver. Take medications as directed.  Only take over-the-counter or prescription medicines for pain, discomfort, or fever as directed by your caregiver.  Whether the tooth ache today is caused by infection or dental disease, you should see your dentist as soon as possible for further care. SEEK MEDICAL CARE IF: The exam and treatment you received today has been provided on an emergency basis only. This is not a substitute for complete medical or dental care. If your problem worsens or new problems (symptoms) appear, and you are unable to meet with your dentist, call or return to this location. SEEK IMMEDIATE MEDICAL CARE IF:   You have a fever.  You develop redness and swelling of your face, jaw, or neck.  You are unable to open your mouth.  You have severe pain uncontrolled by pain medicine. MAKE SURE YOU:   Understand these instructions.  Will watch your condition.  Will get help right away if you are not doing well or get worse. Document Released: 10/21/2005 Document Revised: 01/13/2012 Document Reviewed: 06/08/2008 [facility] Patient Information 2014 [place], [place].

## 2014-01-15 NOTE — ED Notes (Signed)
Reports continued to smoke after the teeth were pulled.  States ' if you tell an alcoholic not to drink, yeah right.  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

## 2014-01-15 NOTE — ED Notes (Signed)
Had some teeth pulled on the 11th.  Here requesting additional Hydrocodone.

## 2014-01-15 NOTE — ED Notes (Signed)
NP at bedside.

## 2014-01-16 NOTE — ED Provider Notes (Signed)
Medical screening examination/treatment/procedure(s) were performed by non-physician practitioner and as supervising physician I was immediately available for consultation/collaboration.   EKG Interpretation None        Lindee Leason, MD 01/16/14 0659 

## 2014-01-17 ENCOUNTER — Emergency Department (HOSPITAL_BASED_OUTPATIENT_CLINIC_OR_DEPARTMENT_OTHER)
Admission: EM | Admit: 2014-01-17 | Discharge: 2014-01-17 | Disposition: A | Discharge status: Home / Self Care | Payer: Medicaid Other | Attending: Emergency Medicine | Admitting: Emergency Medicine

## 2014-01-17 ENCOUNTER — Encounter (HOSPITAL_BASED_OUTPATIENT_CLINIC_OR_DEPARTMENT_OTHER): Payer: Self-pay | Admitting: Emergency Medicine

## 2014-01-17 DIAGNOSIS — K219 Gastro-esophageal reflux disease without esophagitis: Secondary | ICD-10-CM | POA: Insufficient documentation

## 2014-01-17 DIAGNOSIS — F411 Generalized anxiety disorder: Secondary | ICD-10-CM | POA: Insufficient documentation

## 2014-01-17 DIAGNOSIS — K0889 Other specified disorders of teeth and supporting structures: Secondary | ICD-10-CM

## 2014-01-17 DIAGNOSIS — Z792 Long term (current) use of antibiotics: Secondary | ICD-10-CM | POA: Insufficient documentation

## 2014-01-17 DIAGNOSIS — K089 Disorder of teeth and supporting structures, unspecified: Secondary | ICD-10-CM | POA: Insufficient documentation

## 2014-01-17 DIAGNOSIS — Z79899 Other long term (current) drug therapy: Secondary | ICD-10-CM | POA: Insufficient documentation

## 2014-01-17 DIAGNOSIS — F17200 Nicotine dependence, unspecified, uncomplicated: Secondary | ICD-10-CM | POA: Insufficient documentation

## 2014-01-17 MED ORDER — MELOXICAM 7.5 MG PO TABS: 7.5000 mg | ORAL_TABLET | Freq: Every day | ORAL | Status: DC

## 2014-01-17 MED ORDER — CLINDAMYCIN HCL 300 MG PO CAPS: 300.0000 mg | ORAL_CAPSULE | Freq: Four times a day (QID) | ORAL | Status: DC

## 2014-01-17 MED ORDER — CHLORHEXIDINE GLUCONATE 0.12 % MT SOLN: 15.0000 mL | Freq: Two times a day (BID) | OROMUCOSAL | Status: DC

## 2014-01-17 NOTE — ED Notes (Signed)
Pt reports left upper dental pain- had tooth pulled this week

## 2014-01-17 NOTE — ED Provider Notes (Signed)
CSN: [id number]     Arrival date & time 01/17/14  0020 History   First MD Initiated Contact with Patient 01/17/14 0126     Chief Complaint  Patient presents with  . Dental Pain     (Consider location/radiation/quality/duration/timing/severity/associated sxs/prior Treatment) Patient is a 38 y.o. female presenting with tooth pain. The history is provided by the patient.  Dental Pain Location:  Upper Upper teeth location:  12/LU 1st bicuspid Quality:  Dull Severity:  Severe Onset quality:  Sudden Timing:  Constant Progression:  Unchanged Chronicity:  New Context: recent dental surgery   Context: filling still in place   Previous work-up:  Dental exam Relieved by:  Nothing Worsened by:  Nothing tried Ineffective treatments:  Acetaminophen and NSAIDs (norco) Associated symptoms: no fever   Risk factors: smoking     Past Medical History  Diagnosis Date  . GERD (gastroesophageal reflux disease)   . Anxiety   . Back pain    Past Surgical History  Procedure Laterality Date  . Leg surgery    . Tubal ligation     No family history on file. History  Substance Use Topics  . Smoking status: Current Every Day Smoker -- 1.00 packs/day  . Smokeless tobacco: Never Used  . Alcohol Use: No   OB History   Grav Para Term Preterm Abortions TAB SAB Ect Mult Living                 Review of Systems  Constitutional: Negative for fever.  All other systems reviewed and are negative.      Allergies  Ultram  Home Medications   Current Outpatient Rx  Name  Route  Sig  Dispense  Refill  . amoxicillin (AMOXIL) 500 MG capsule   Oral   Take 500 mg by mouth 3 (three) times daily.         . clonazePAM (KLONOPIN) 1 MG tablet   Oral   Take 1 mg by mouth 2 (two) times daily as needed. For anxiety         . varenicline (CHANTIX) 1 MG tablet   Oral   Take 1 mg by mouth 2 (two) times daily.         . chlorhexidine (PERIDEX) 0.12 % solution   Mouth/Throat   Use as directed  15 mLs in the mouth or throat 2 (two) times daily.   120 mL   0   . clindamycin (CLEOCIN) 300 MG capsule   Oral   Take 1 capsule (300 mg total) by mouth 4 (four) times daily. X 7 days   28 capsule   0   . HYDROcodone-acetaminophen (NORCO) 5-325 MG per tablet   Oral   Take 1-2 tablets by mouth every 6 (six) hours as needed. For pain         . HYDROcodone-acetaminophen (NORCO/VICODIN) 5-325 MG per tablet   Oral   Take 1 tablet by mouth every 4 (four) hours as needed.   16 tablet   0   . ibuprofen (ADVIL,MOTRIN) 200 MG tablet   Oral   Take 400 mg by mouth every 6 (six) hours as needed. For pain         . meloxicam (MOBIC) 7.5 MG tablet   Oral   Take 1 tablet (7.5 mg total) by mouth daily.   7 tablet   0   . omeprazole (PRILOSEC) 20 MG capsule   Oral   Take 20 mg by mouth 2 (two) times daily.  BP 122/104  Pulse 81  Temp(Src) 98.3 F (36.8 C) (Oral)  Resp 20  SpO2 99%  LMP 01/08/2014 Physical Exam  Constitutional: She is oriented to person, place, and time. She appears well-developed and well-nourished.  Appears to be under the influence of substances  HENT:  Head: Normocephalic and atraumatic.  Mouth/Throat: Oropharynx is clear and moist. No oropharyngeal exudate.    Eyes: Conjunctivae and EOM are normal.  Pinpoint pupils B  Neck: Normal range of motion. Neck supple.  Cardiovascular: Normal rate and regular rhythm.   Pulmonary/Chest: Effort normal and breath sounds normal. She has no wheezes. She has no rales.  Abdominal: Soft. Bowel sounds are normal. There is no tenderness. There is no rebound and no guarding.  Musculoskeletal: Normal range of motion.  Neurological: She is alert and oriented to person, place, and time.  Skin: Skin is warm and dry.  Psychiatric: She has a normal mood and affect.    ED Course  Procedures (including critical care time) Labs Review Labs Reviewed - No data to display Imaging Review No results found.   EKG  Interpretation None      MDM   Final diagnoses:  Pain, dental    I will prescribe clindamycin and meloxicam and peridex.  The surgical site needs to be seen by her surgeon.  She took all 16 tabs and appears to be under the influence.  We will NOT be prescribing further narcotics.      Jasmine Awe, MD 01/17/14 [phone]

## 2014-01-17 NOTE — Discharge Instructions (Signed)
Dental Care and Dentist Visits Dental care supports good overall health. Regular dental visits can also help you avoid dental pain, bleeding, infection, and other more serious health problems in the future. It is important to keep the mouth healthy because diseases in the teeth, gums, and other oral tissues can spread to other areas of the body. Some problems, such as diabetes, heart disease, and pre-term labor have been associated with poor oral health.  See your dentist every 6 months. If you experience emergency problems such as a toothache or broken tooth, go to the dentist right away. If you see your dentist regularly, you may catch problems early. It is easier to be treated for problems in the early stages.  WHAT TO EXPECT AT A DENTIST VISIT  Your dentist will look for many common oral health problems and recommend proper treatment. At your regular dental visit, you can expect:  Gentle cleaning of the teeth and gums. This includes scraping and polishing. This helps to remove the sticky substance around the teeth and gums (plaque). Plaque forms in the mouth shortly after eating. Over time, plaque hardens on the teeth as tartar. If tartar is not removed regularly, it can cause problems. Cleaning also helps remove stains.  Periodic X-rays. These pictures of the teeth and supporting bone will help your dentist assess the health of your teeth.  Periodic fluoride treatments. Fluoride is a natural mineral shown to help strengthen teeth. Fluoride treatmentinvolves applying a fluoride gel or varnish to the teeth. It is most commonly done in children.  Examination of the mouth, tongue, jaws, teeth, and gums to look for any oral health problems, such as:  Cavities (dental caries). This is decay on the tooth caused by plaque, sugar, and acid in the mouth. It is best to catch a cavity when it is small.  Inflammation of the gums caused by plaque buildup (gingivitis).  Problems with the mouth or malformed  or misaligned teeth.  Oral cancer or other diseases of the soft tissues or jaws. KEEP YOUR TEETH AND GUMS HEALTHY For healthy teeth and gums, follow these general guidelines as well as your dentist's specific advice:  Have your teeth professionally cleaned at the dentist every 6 months.  Brush twice daily with a fluoride toothpaste.  Floss your teeth daily.  Ask your dentist if you need fluoride supplements, treatments, or fluoride toothpaste.  Eat a healthy diet. Reduce foods and drinks with added sugar.  Avoid smoking. TREATMENT FOR ORAL HEALTH PROBLEMS If you have oral health problems, treatment varies depending on the conditions present in your teeth and gums.  Your caregiver will most likely recommend good oral hygiene at each visit.  For cavities, gingivitis, or other oral health disease, your caregiver will perform a procedure to treat the problem. This is typically done at a separate appointment. Sometimes your caregiver will refer you to another dental specialist for specific tooth problems or for surgery. SEEK IMMEDIATE DENTAL CARE IF:  You have pain, bleeding, or soreness in the gum, tooth, jaw, or mouth area.  A permanent tooth becomes loose or separated from the gum socket.  You experience a blow or injury to the mouth or jaw area. Document Released: 07/03/2011 Document Revised: 01/13/2012 Document Reviewed: 07/03/2011 [employer] Patient Information 2014 [employer].  

## 2015-09-26 DIAGNOSIS — F39 Unspecified mood [affective] disorder: Secondary | ICD-10-CM | POA: Diagnosis present

## 2015-12-22 ENCOUNTER — Encounter (HOSPITAL_BASED_OUTPATIENT_CLINIC_OR_DEPARTMENT_OTHER): Payer: Self-pay | Admitting: *Deleted

## 2015-12-22 ENCOUNTER — Emergency Department (HOSPITAL_BASED_OUTPATIENT_CLINIC_OR_DEPARTMENT_OTHER)
Admission: EM | Admit: 2015-12-22 | Discharge: 2015-12-22 | Disposition: A | Discharge status: Home / Self Care | Payer: Medicaid Other | Attending: Emergency Medicine | Admitting: Emergency Medicine

## 2015-12-22 ENCOUNTER — Emergency Department (HOSPITAL_BASED_OUTPATIENT_CLINIC_OR_DEPARTMENT_OTHER): Payer: Medicaid Other

## 2015-12-22 DIAGNOSIS — W108XXA Fall (on) (from) other stairs and steps, initial encounter: Secondary | ICD-10-CM | POA: Insufficient documentation

## 2015-12-22 DIAGNOSIS — S5002XA Contusion of left elbow, initial encounter: Secondary | ICD-10-CM | POA: Insufficient documentation

## 2015-12-22 DIAGNOSIS — F17200 Nicotine dependence, unspecified, uncomplicated: Secondary | ICD-10-CM | POA: Insufficient documentation

## 2015-12-22 DIAGNOSIS — S40022A Contusion of left upper arm, initial encounter: Secondary | ICD-10-CM | POA: Diagnosis not present

## 2015-12-22 DIAGNOSIS — S59902A Unspecified injury of left elbow, initial encounter: Secondary | ICD-10-CM | POA: Diagnosis present

## 2015-12-22 DIAGNOSIS — Y998 Other external cause status: Secondary | ICD-10-CM | POA: Diagnosis not present

## 2015-12-22 DIAGNOSIS — Z79899 Other long term (current) drug therapy: Secondary | ICD-10-CM | POA: Diagnosis not present

## 2015-12-22 DIAGNOSIS — F419 Anxiety disorder, unspecified: Secondary | ICD-10-CM | POA: Diagnosis not present

## 2015-12-22 DIAGNOSIS — Y9389 Activity, other specified: Secondary | ICD-10-CM | POA: Insufficient documentation

## 2015-12-22 DIAGNOSIS — S20221A Contusion of right back wall of thorax, initial encounter: Secondary | ICD-10-CM | POA: Insufficient documentation

## 2015-12-22 DIAGNOSIS — S5012XA Contusion of left forearm, initial encounter: Secondary | ICD-10-CM | POA: Insufficient documentation

## 2015-12-22 DIAGNOSIS — Y9289 Other specified places as the place of occurrence of the external cause: Secondary | ICD-10-CM | POA: Diagnosis not present

## 2015-12-22 DIAGNOSIS — K219 Gastro-esophageal reflux disease without esophagitis: Secondary | ICD-10-CM | POA: Diagnosis not present

## 2015-12-22 MED ORDER — HYDROCODONE-ACETAMINOPHEN 5-325 MG PO TABS
1.0000 | ORAL_TABLET | Freq: Four times a day (QID) | ORAL | Status: DC | PRN
Start: 2015-12-22 — End: 2017-03-03

## 2015-12-22 NOTE — ED Notes (Signed)
Pt c/o x 1 day ago left elbow and lower back pain

## 2015-12-22 NOTE — ED Provider Notes (Signed)
CSN: [id number]     Arrival date & time 12/22/15  1923 History   First MD Initiated Contact with Patient 12/22/15 2023     Chief Complaint  Patient presents with  . Fall     (Consider location/radiation/quality/duration/timing/severity/associated sxs/prior Treatment) HPI Christina Stokes is a 40 y.o. female who presents to the ED with left elbow and lower back pain after she fell one day ago. She reports falling down steps. She has bruising to the left elbow and the lower back. She denies LOC or head injury. No n/v, no confusion. She denies any other injuries. She states that she just wants her elbow checked.   Past Medical History  Diagnosis Date  . GERD (gastroesophageal reflux disease)   . Anxiety   . Back pain    Past Surgical History  Procedure Laterality Date  . Leg surgery    . Tubal ligation     History reviewed. No pertinent family history. Social History  Substance Use Topics  . Smoking status: Current Every Day Smoker -- 1.00 packs/day  . Smokeless tobacco: Never Used  . Alcohol Use: No   OB History    No data available     Review of Systems  Musculoskeletal: Positive for arthralgias.  Skin: Positive for color change.  all other systems negative    Allergies  Ultram  Home Medications   Prior to Admission medications   Medication Sig Start Date End Date Taking? Authorizing Provider  chlorhexidine (PERIDEX) 0.12 % solution Use as directed 15 mLs in the mouth or throat 2 (two) times daily. 01/17/14   April Palumbo, MD  clonazePAM (KLONOPIN) 1 MG tablet Take 1 mg by mouth 2 (two) times daily as needed. For anxiety    Historical Provider, MD  HYDROcodone-acetaminophen (NORCO) 5-325 MG tablet Take 1 tablet by mouth every 6 (six) hours as needed for moderate pain. 12/22/15   Catina Nuss Orlene Och, NP  ibuprofen (ADVIL,MOTRIN) 200 MG tablet Take 400 mg by mouth every 6 (six) hours as needed. For pain    Historical Provider, MD  omeprazole (PRILOSEC) 20 MG capsule Take 20 mg  by mouth 2 (two) times daily.     Historical Provider, MD  varenicline (CHANTIX) 1 MG tablet Take 1 mg by mouth 2 (two) times daily.    Historical Provider, MD   BP 148/100 mmHg  Pulse 81  Temp(Src) 99.2 F (37.3 C) (Oral)  Resp 20  Ht  (1.626 m)  Wt 102.059 kg  BMI 38.60 kg/m2  SpO2 100%  LMP 12/19/2015 Physical Exam  Constitutional: She is oriented to person, place, and time. She appears well-developed and well-nourished. No distress.  HENT:  Head: Normocephalic.  Right Ear: Tympanic membrane normal.  Left Ear: Tympanic membrane normal.  Mouth/Throat: Uvula is midline, oropharynx is clear and moist and mucous membranes are normal.  Eyes: Conjunctivae and EOM are normal. Pupils are equal, round, and reactive to light.  Neck: Neck supple.  Cardiovascular: Normal rate and regular rhythm.   Pulmonary/Chest: Effort normal and breath sounds normal.  Abdominal: Soft. There is no tenderness.  Musculoskeletal: Normal range of motion.       Left elbow: She exhibits swelling. She exhibits no deformity. Decreased range of motion: due to pain.  Large area of ecchymosis to the posterior aspect of the left elbow and upper arm. Radial pulses 2+, adequate circulation. Equal grips.  Ecchymosis noted to the lower lumbar area that is mildly tender with palpation. No CVA tenderness  Neurological: She  is alert and oriented to person, place, and time. No cranial nerve deficit.  Skin: Skin is warm and dry.  Psychiatric: She has a normal mood and affect. Her behavior is normal.  Nursing note and vitals reviewed.   ED Course  Procedures (including critical care time) Labs Review Labs Reviewed - No data to display  Imaging Review Dg Elbow Complete Left  12/22/2015  CLINICAL DATA:  Patient fell down stairs yesterday at home. Bruising and pain to the posterior elbow. EXAM: LEFT ELBOW - COMPLETE 3+ VIEW COMPARISON:  None. FINDINGS: Soft tissue swelling and infiltration consistent with contusions  over the posterior aspect of the left elbow. No significant effusion. No evidence of acute fracture or dislocation. No focal bone lesion or bone destruction. Bone cortex appears intact. No radiopaque soft tissue foreign bodies or soft tissue gas collections. IMPRESSION: Soft tissue swelling and infiltration over the posterior left elbow consistent with soft tissue contusions. No acute bony abnormalities. Electronically Signed   By: Burman Nieves M.D.   On: 12/22/2015 22:32    MDM  40 y.o. female with pain, swelling and ecchymosis to the left elbow and ecchymosis to the lower back s/p fall one day ago. Stable for d/c without fracture noted on x-ray and ambulatory without difficulty and no focal neuro deficits. Patient will take advil for pain and return for worsening symptoms. Ace wrap to elbow, ice and rest.   Final diagnoses:  Contusion of left elbow and forearm, initial encounter  Contusion of back, right, initial encounter       [facility], NP 12/23/15 1810  Azalia Bilis, MD 12/23/15 [phone]

## 2015-12-22 NOTE — Discharge Instructions (Signed)
Take ibuprofen in addition to the medication we give you. Apply ice to the area. Do not drive while taking the narcotic as it will make you sleepy.

## 2016-04-15 ENCOUNTER — Emergency Department (HOSPITAL_BASED_OUTPATIENT_CLINIC_OR_DEPARTMENT_OTHER)
Admission: EM | Admit: 2016-04-15 | Discharge: 2016-04-16 | Disposition: A | Discharge status: Home / Self Care | Payer: Medicaid Other | Attending: Emergency Medicine | Admitting: Emergency Medicine

## 2016-04-15 ENCOUNTER — Emergency Department (HOSPITAL_BASED_OUTPATIENT_CLINIC_OR_DEPARTMENT_OTHER): Payer: Medicaid Other

## 2016-04-15 ENCOUNTER — Encounter (HOSPITAL_BASED_OUTPATIENT_CLINIC_OR_DEPARTMENT_OTHER): Payer: Self-pay | Admitting: *Deleted

## 2016-04-15 DIAGNOSIS — S2001XA Contusion of right breast, initial encounter: Secondary | ICD-10-CM | POA: Insufficient documentation

## 2016-04-15 DIAGNOSIS — Y92410 Unspecified street and highway as the place of occurrence of the external cause: Secondary | ICD-10-CM | POA: Insufficient documentation

## 2016-04-15 DIAGNOSIS — Y999 Unspecified external cause status: Secondary | ICD-10-CM | POA: Insufficient documentation

## 2016-04-15 DIAGNOSIS — S8011XA Contusion of right lower leg, initial encounter: Secondary | ICD-10-CM | POA: Insufficient documentation

## 2016-04-15 DIAGNOSIS — F17200 Nicotine dependence, unspecified, uncomplicated: Secondary | ICD-10-CM | POA: Diagnosis not present

## 2016-04-15 DIAGNOSIS — S3991XA Unspecified injury of abdomen, initial encounter: Secondary | ICD-10-CM | POA: Diagnosis present

## 2016-04-15 DIAGNOSIS — E876 Hypokalemia: Secondary | ICD-10-CM | POA: Diagnosis not present

## 2016-04-15 DIAGNOSIS — S301XXA Contusion of abdominal wall, initial encounter: Secondary | ICD-10-CM | POA: Diagnosis not present

## 2016-04-15 DIAGNOSIS — Y9389 Activity, other specified: Secondary | ICD-10-CM | POA: Diagnosis not present

## 2016-04-15 DIAGNOSIS — R52 Pain, unspecified: Secondary | ICD-10-CM

## 2016-04-15 DIAGNOSIS — T148XXA Other injury of unspecified body region, initial encounter: Secondary | ICD-10-CM

## 2016-04-15 LAB — COMPREHENSIVE METABOLIC PANEL
ALK PHOS: 78 U/L (ref 38–126)
ALT: 19 U/L (ref 14–54)
AST: 24 U/L (ref 15–41)
Albumin: 4.3 g/dL (ref 3.5–5.0)
Anion gap: 10 (ref 5–15)
BILIRUBIN TOTAL: 1 mg/dL (ref 0.3–1.2)
BUN: 5 mg/dL — ABNORMAL LOW (ref 6–20)
CALCIUM: 8.9 mg/dL (ref 8.9–10.3)
CO2: 22 mmol/L (ref 22–32)
CREATININE: 0.77 mg/dL (ref 0.44–1.00)
Chloride: 102 mmol/L (ref 101–111)
Glucose, Bld: 110 mg/dL — ABNORMAL HIGH (ref 65–99)
Potassium: 2.9 mmol/L — ABNORMAL LOW (ref 3.5–5.1)
Sodium: 134 mmol/L — ABNORMAL LOW (ref 135–145)
Total Protein: 7.4 g/dL (ref 6.5–8.1)

## 2016-04-15 LAB — CBC WITH DIFFERENTIAL/PLATELET
Basophils Absolute: 0 10*3/uL (ref 0.0–0.1)
Basophils Relative: 0 %
Eosinophils Absolute: 0.1 10*3/uL (ref 0.0–0.7)
Eosinophils Relative: 1 %
HEMATOCRIT: 37.1 % (ref 36.0–46.0)
HEMOGLOBIN: 12.8 g/dL (ref 12.0–15.0)
LYMPHS ABS: 2.3 10*3/uL (ref 0.7–4.0)
LYMPHS PCT: 28 %
MCH: 29.8 pg (ref 26.0–34.0)
MCHC: 34.5 g/dL (ref 30.0–36.0)
MCV: 86.5 fL (ref 78.0–100.0)
Monocytes Absolute: 0.6 10*3/uL (ref 0.1–1.0)
Monocytes Relative: 8 %
NEUTROS PCT: 63 %
Neutro Abs: 5.1 10*3/uL (ref 1.7–7.7)
Platelets: 185 10*3/uL (ref 150–400)
RBC: 4.29 MIL/uL (ref 3.87–5.11)
RDW: 13.6 % (ref 11.5–15.5)
WBC: 8.1 10*3/uL (ref 4.0–10.5)

## 2016-04-15 LAB — LIPASE, BLOOD: LIPASE: 15 U/L (ref 11–51)

## 2016-04-15 MED ORDER — CYCLOBENZAPRINE HCL 10 MG PO TABS
10.0000 mg | ORAL_TABLET | Freq: Once | ORAL | Status: AC
  Administered 2016-04-15: 10 mg via ORAL
  Filled 2016-04-15: qty 1

## 2016-04-15 MED ORDER — HYDROCODONE-ACETAMINOPHEN 5-325 MG PO TABS
1.0000 | ORAL_TABLET | Freq: Once | ORAL | Status: AC
  Administered 2016-04-15: 1 via ORAL
  Filled 2016-04-15: qty 1

## 2016-04-15 MED ORDER — CYCLOBENZAPRINE HCL 10 MG PO TABS: 10.0000 mg | ORAL_TABLET | Freq: Two times a day (BID) | ORAL | Status: DC | PRN

## 2016-04-15 MED ORDER — SODIUM CHLORIDE 0.9 % IV BOLUS (SEPSIS)
1000.0000 mL | Freq: Once | INTRAVENOUS | Status: AC
  Administered 2016-04-15: 1000 mL via INTRAVENOUS

## 2016-04-15 MED ORDER — NAPROXEN 500 MG PO TABS: 500.0000 mg | ORAL_TABLET | Freq: Two times a day (BID) | ORAL | Status: DC

## 2016-04-15 MED ORDER — POTASSIUM CHLORIDE CRYS ER 20 MEQ PO TBCR
40.0000 meq | EXTENDED_RELEASE_TABLET | Freq: Once | ORAL | Status: AC
  Administered 2016-04-15: 40 meq via ORAL
  Filled 2016-04-15: qty 2

## 2016-04-15 MED ORDER — MAGNESIUM OXIDE 400 MG PO TABS
400.0000 mg | ORAL_TABLET | Freq: Every day | ORAL | Status: AC
Start: 2016-04-15 — End: 2016-04-17

## 2016-04-15 MED ORDER — POTASSIUM CHLORIDE ER 10 MEQ PO TBCR: 20.0000 meq | EXTENDED_RELEASE_TABLET | Freq: Two times a day (BID) | ORAL | Status: DC

## 2016-04-15 MED ORDER — KETOROLAC TROMETHAMINE 15 MG/ML IJ SOLN
15.0000 mg | Freq: Once | INTRAMUSCULAR | Status: AC
  Administered 2016-04-15: 15 mg via INTRAVENOUS
  Filled 2016-04-15: qty 1
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
## 2016-04-15 NOTE — ED Notes (Signed)
Pt verbalizes understanding of d/c instructions and denies any further needs at this time. 

## 2016-06-04 ENCOUNTER — Encounter (HOSPITAL_BASED_OUTPATIENT_CLINIC_OR_DEPARTMENT_OTHER): Payer: Self-pay | Admitting: *Deleted

## 2016-06-04 ENCOUNTER — Emergency Department (HOSPITAL_BASED_OUTPATIENT_CLINIC_OR_DEPARTMENT_OTHER)
Admission: EM | Admit: 2016-06-04 | Discharge: 2016-06-04 | Disposition: A | Discharge status: Home / Self Care | Payer: Medicaid Other | Attending: Emergency Medicine | Admitting: Emergency Medicine

## 2016-06-04 DIAGNOSIS — Z79899 Other long term (current) drug therapy: Secondary | ICD-10-CM | POA: Insufficient documentation

## 2016-06-04 DIAGNOSIS — R300 Dysuria: Secondary | ICD-10-CM | POA: Insufficient documentation

## 2016-06-04 DIAGNOSIS — F17200 Nicotine dependence, unspecified, uncomplicated: Secondary | ICD-10-CM | POA: Insufficient documentation

## 2016-06-04 LAB — WET PREP, GENITAL
CLUE CELLS WET PREP: NONE SEEN
SPERM: NONE SEEN
TRICH WET PREP: NONE SEEN
Yeast Wet Prep HPF POC: NONE SEEN

## 2016-06-04 LAB — URINALYSIS, ROUTINE W REFLEX MICROSCOPIC
Bilirubin Urine: NEGATIVE
Glucose, UA: NEGATIVE mg/dL
Hgb urine dipstick: NEGATIVE
Ketones, ur: NEGATIVE mg/dL
LEUKOCYTES UA: NEGATIVE
NITRITE: NEGATIVE
PH: 5.5 (ref 5.0–8.0)
Protein, ur: NEGATIVE mg/dL
SPECIFIC GRAVITY, URINE: 1.037 — AB (ref 1.005–1.030)

## 2016-06-04 MED ORDER — PHENAZOPYRIDINE HCL 200 MG PO TABS: 200.0000 mg | ORAL_TABLET | Freq: Three times a day (TID) | ORAL | 0 refills | Status: DC

## 2016-06-04 NOTE — ED Triage Notes (Signed)
Dysuria x 2 months. She has been treated with Cipro, Keflex, Bactrim, and Macrobid with continued symptoms.

## 2016-06-04 NOTE — ED Provider Notes (Signed)
[facility] Provider Note   CSN: [id number] Arrival date & time: 06/04/16  1842  First Provider Contact:  First MD Initiated Contact with Patient 06/04/16 1856     By signing my name below, I, Freida Busman, attest that this documentation has been prepared under the direction and in the presence of Melene Plan, DO . Electronically Signed: Freida Busman, Scribe. 06/04/2016. 8:41 PM. History   Chief Complaint Chief Complaint  Patient presents with  . Dysuria    The history is provided by the patient. No language interpreter was used.  Dysuria   This is a recurrent problem. The current episode started more than 1 week ago. The pain is at a severity of 3/10. There has been no fever. Associated symptoms include flank pain. Pertinent negatives include no chills, no nausea, no vomiting, no discharge and no urgency. She has tried antibiotics for the symptoms. Her past medical history is significant for kidney stones.     HPI Comments:  Christina Stokes is a 40 y.o. female with a PMHx of kidney stones, who presents to the Emergency Department complaining of persistent dysuria which initially began ~ 2 months ago. She reports associated right flank pain. Pt states she was been diagnosed with UTI at [facility] and has been switched from keflex to cipro to macrobid and has only had mild improvement. She denies fever, vaginal bleeding, and discharge.    Past Medical History:  Diagnosis Date  . Anxiety   . Back pain   . GERD (gastroesophageal reflux disease)     There are no active problems to display for this patient.   Past Surgical History:  Procedure Laterality Date  . LEG SURGERY    . TUBAL LIGATION      OB History    No data available       Home Medications    Prior to Admission medications   Medication Sig Start Date End Date Taking? Authorizing Provider  clonazePAM (KLONOPIN) 1 MG tablet Take 1 mg by mouth 2 (two) times daily as needed. For anxiety   Yes  Historical Provider, MD  cyclobenzaprine (FLEXERIL) 10 MG tablet Take 1 tablet (10 mg total) by mouth 2 (two) times daily as needed for muscle spasms. 04/15/16  Yes Alvira Monday, MD  ibuprofen (ADVIL,MOTRIN) 200 MG tablet Take 400 mg by mouth every 6 (six) hours as needed. For pain   Yes Historical Provider, MD  chlorhexidine (PERIDEX) 0.12 % solution Use as directed 15 mLs in the mouth or throat 2 (two) times daily. 01/17/14   April Palumbo, MD  HYDROcodone-acetaminophen (NORCO) 5-325 MG tablet Take 1 tablet by mouth every 6 (six) hours as needed for moderate pain. 12/22/15   Hope Orlene Och, NP  naproxen (NAPROSYN) 500 MG tablet Take 1 tablet (500 mg total) by mouth 2 (two) times daily. 04/15/16   Alvira Monday, MD  omeprazole (PRILOSEC) 20 MG capsule Take 20 mg by mouth 2 (two) times daily.     Historical Provider, MD  phenazopyridine (PYRIDIUM) 200 MG tablet Take 1 tablet (200 mg total) by mouth 3 (three) times daily. 06/04/16   Melene Plan, DO  potassium chloride (K-DUR) 10 MEQ tablet Take 2 tablets (20 mEq total) by mouth 2 (two) times daily. 04/15/16 04/17/16  Alvira Monday, MD  varenicline (CHANTIX) 1 MG tablet Take 1 mg by mouth 2 (two) times daily.    Historical Provider, MD    Family History No family history on file.  Social History Social  History  Substance Use Topics  . Smoking status: Current Every Day Smoker    Packs/day: 1.00  . Smokeless tobacco: Never Used  . Alcohol use No    Allergies   Ultram [tramadol hcl]   Review of Systems Review of Systems  Constitutional: Negative for chills and fever.  HENT: Negative for congestion and rhinorrhea.   Eyes: Negative for redness and visual disturbance.  Respiratory: Negative for shortness of breath and wheezing.   Cardiovascular: Negative for chest pain and palpitations.  Gastrointestinal: Negative for nausea and vomiting.  Genitourinary: Positive for dysuria and flank pain. Negative for urgency, vaginal bleeding and vaginal  discharge.  Musculoskeletal: Negative for arthralgias and myalgias.  Skin: Negative for pallor and wound.  Neurological: Negative for dizziness and headaches.  All other systems reviewed and are negative.  Physical Exam Updated Vital Signs BP 133/92   Pulse 82   Temp 98.3 F (36.8 C) (Oral)   Resp 18   Ht  (1.6 m)   Wt 208 lb (94.3 kg)   LMP 05/28/2016   SpO2 99%   BMI 36.85 kg/m   Physical Exam  Constitutional: She is oriented to person, place, and time. She appears well-developed and well-nourished. No distress.  HENT:  Head: Normocephalic and atraumatic.  Eyes: EOM are normal. Pupils are equal, round, and reactive to light.  Neck: Normal range of motion. Neck supple.  Cardiovascular: Normal rate and regular rhythm.  Exam reveals no gallop and no friction rub.   No murmur heard. Pulmonary/Chest: Effort normal. She has no wheezes. She has no rales.  Abdominal: Soft. She exhibits no distension. There is no tenderness.  Genitourinary: Uterus is not enlarged and not tender. Cervix exhibits discharge (whitish). Cervix exhibits no motion tenderness. Right adnexum displays no mass, no tenderness and no fullness. Left adnexum displays no mass, no tenderness and no fullness.  Genitourinary Comments: Whitish discharge with no CMT Chaperone was present for exam which was performed with no discomfort or complications.   Musculoskeletal: She exhibits no edema or tenderness.  Neurological: She is alert and oriented to person, place, and time.  Skin: Skin is warm and dry. She is not diaphoretic.  Psychiatric: Her behavior is normal. Her mood appears anxious.  Nursing note and vitals reviewed.  ED Treatments / Results  DIAGNOSTIC STUDIES:  Oxygen Saturation is 100% on RA, normal by my interpretation.    COORDINATION OF CARE:  6:58 PM Discussed treatment plan with pt at bedside and pt agreed to plan.  Labs (all labs ordered are listed, but only abnormal results are  displayed) Labs Reviewed  WET PREP, GENITAL - Abnormal; Notable for the following:       Result Value   WBC, Wet Prep HPF POC MODERATE (*)    All other components within normal limits  URINALYSIS, ROUTINE W REFLEX MICROSCOPIC (NOT AT [facility]) - Abnormal; Notable for the following:    Specific Gravity, Urine 1.037 (*)    All other components within normal limits  URINE CULTURE  GC/CHLAMYDIA PROBE AMP ([place]) NOT AT [facility]    EKG  EKG Interpretation None       Radiology No results found.  Procedures Procedures   Medications Ordered in ED Medications - No data to display   Initial Impression / Assessment and Plan / ED Course  I have reviewed the triage vital signs and the nursing notes.  Pertinent labs & imaging results that were available during my care of the patient were reviewed by  me and considered in my medical decision making (see chart for details).  Clinical Course    40 yo F With a chief complaints of dysuria. This been off and on for quite some time and is gone through 3 different rounds of antibiotics. The patient feels like she still having symptoms. Was seen the first 3 times at [facility]. She feels that they're just giving her more antibiotics without improving her symptoms.  Patient's UA without UTI. Pelvic exam with whitish discharge. Wet prep with white blood cells but no clue cells. Discharge home with Pyridium. Given urology follow-up if she continues to have symptoms.   8:41 PM:  I have discussed the diagnosis/risks/treatment options with the patient and family and believe the pt to be eligible for discharge home to follow-up with PCP, urology. We also discussed returning to the ED immediately if new or worsening sx occur. We discussed the sx which are most concerning (e.g., sudden worsening pain, fever, inability to tolerate by mouth) that necessitate immediate return. Medications administered to the patient during their visit and any  new prescriptions provided to the patient are listed below.  Medications given during this visit Medications - No data to display   The patient appears reasonably screen and/or stabilized for discharge and I doubt any other medical condition or other [facility] requiring further screening, evaluation, or treatment in the ED at this time prior to discharge.    Final Clinical Impressions(s) / ED Diagnoses   Final diagnoses:  Dysuria    New Prescriptions Discharge Medication List as of 06/04/2016  8:32 PM    START taking these medications   Details  phenazopyridine (PYRIDIUM) 200 MG tablet Take 1 tablet (200 mg total) by mouth 3 (three) times daily., Starting Tue 06/04/2016, Print        I personally performed the services described in this documentation, which was scribed in my presence. The recorded information has been reviewed and is accurate.     Melene Plan, DO 06/04/16 2042

## 2016-06-05 LAB — GC/CHLAMYDIA PROBE AMP (~~LOC~~) NOT AT ARMC
CHLAMYDIA, DNA PROBE: NEGATIVE
NEISSERIA GONORRHEA: NEGATIVE

## 2016-10-31 DIAGNOSIS — R000 Tachycardia, unspecified: Secondary | ICD-10-CM | POA: Insufficient documentation

## 2016-10-31 DIAGNOSIS — F17211 Nicotine dependence, cigarettes, in remission: Secondary | ICD-10-CM | POA: Insufficient documentation

## 2017-03-03 ENCOUNTER — Encounter (HOSPITAL_BASED_OUTPATIENT_CLINIC_OR_DEPARTMENT_OTHER): Payer: Self-pay | Admitting: Emergency Medicine

## 2017-03-03 ENCOUNTER — Emergency Department (HOSPITAL_BASED_OUTPATIENT_CLINIC_OR_DEPARTMENT_OTHER)
Admission: EM | Admit: 2017-03-03 | Discharge: 2017-03-03 | Disposition: A | Discharge status: Home / Self Care | Payer: Medicaid Other | Attending: Emergency Medicine | Admitting: Emergency Medicine

## 2017-03-03 DIAGNOSIS — R1032 Left lower quadrant pain: Secondary | ICD-10-CM | POA: Diagnosis present

## 2017-03-03 DIAGNOSIS — K5900 Constipation, unspecified: Secondary | ICD-10-CM | POA: Insufficient documentation

## 2017-03-03 DIAGNOSIS — F17200 Nicotine dependence, unspecified, uncomplicated: Secondary | ICD-10-CM | POA: Diagnosis not present

## 2017-03-03 DIAGNOSIS — Z79899 Other long term (current) drug therapy: Secondary | ICD-10-CM | POA: Insufficient documentation

## 2017-03-03 HISTORY — DX: Opioid dependence, uncomplicated: F11.20

## 2017-03-03 LAB — URINALYSIS, ROUTINE W REFLEX MICROSCOPIC
Bilirubin Urine: NEGATIVE
Glucose, UA: NEGATIVE mg/dL
Hgb urine dipstick: NEGATIVE
Ketones, ur: NEGATIVE mg/dL
Leukocytes, UA: NEGATIVE
NITRITE: NEGATIVE
Protein, ur: NEGATIVE mg/dL
SPECIFIC GRAVITY, URINE: 1.018 (ref 1.005–1.030)
pH: 6 (ref 5.0–8.0)

## 2017-03-03 LAB — PREGNANCY, URINE: Preg Test, Ur: NEGATIVE

## 2017-03-03 MED ORDER — DICYCLOMINE HCL 20 MG PO TABS: 20.0000 mg | ORAL_TABLET | Freq: Two times a day (BID) | ORAL | 0 refills | Status: DC

## 2017-03-03 NOTE — ED Triage Notes (Signed)
Pt c/o abd pain. Pt states she was having some constipation over the weekend and took an enema with good results. Pt states she has had some diarrhea since and continues to have abd cramping. Pt is on suboxone treatment currently.

## 2017-03-03 NOTE — ED Provider Notes (Signed)
[facility] Provider Note   CSN: [id number] Arrival date & time: 03/03/17  1913   By signing my name below, I, Clovis Pu, attest that this documentation has been prepared under the direction and in the presence of Melene Plan, DO  Electronically Signed: Clovis Pu, ED Scribe. 03/03/17. 8:39 PM.   History   Chief Complaint Chief Complaint  Patient presents with  . Abdominal Pain    HPI Comments:  Christina Stokes is a 41 y.o. female who presents to the Emergency Department complaining of acute onset, mild, dull abdominal pain beginning yesterday. She reports intermittent episodes of brief, sharp abdominal pain. Her pain is worse with ambulation. Pt also reports constipation. No alleviating factors noted. Pt denies fevers or any other associated symptoms. Pt also denies a change in dose of the Suboxone she is taking. No other complaints noted at this time.   The history is provided by the patient. No language interpreter was used.  Abdominal Pain   This is a new problem. The current episode started yesterday. The problem occurs constantly. The problem has not changed since onset.The pain is associated with an unknown factor. The quality of the pain is sharp and dull. The pain is mild. Associated symptoms include constipation. Pertinent negatives include fever, nausea, vomiting, dysuria, headaches, arthralgias and myalgias. Exacerbated by: ambulation. Nothing relieves the symptoms. Her past medical history is significant for GERD.    Past Medical History:  Diagnosis Date  . Anxiety   . Back pain   . GERD (gastroesophageal reflux disease)   . Opiate addiction (HCC)   . Opioid dependence on agonist therapy (HCC)     There are no active problems to display for this patient.   Past Surgical History:  Procedure Laterality Date  . LEG SURGERY    . TUBAL LIGATION      OB History    No data available       Home Medications    Prior to Admission medications     Medication Sig Start Date End Date Taking? Authorizing Provider  buprenorphine-naloxone (SUBOXONE) 8-2 mg SUBL SL tablet Place 1 tablet under the tongue 2 (two) times daily.   Yes Historical Provider, MD  cyclobenzaprine (FLEXERIL) 10 MG tablet Take 1 tablet (10 mg total) by mouth 2 (two) times daily as needed for muscle spasms. 04/15/16  Yes Alvira Monday, MD  dicyclomine (BENTYL) 20 MG tablet Take 1 tablet (20 mg total) by mouth 2 (two) times daily. 03/03/17   Melene Plan, DO    Family History No family history on file.  Social History Social History  Substance Use Topics  . Smoking status: Current Every Day Smoker    Packs/day: 1.00  . Smokeless tobacco: Never Used  . Alcohol use No     Allergies   Ultram [tramadol hcl]   Review of Systems Review of Systems  Constitutional: Negative for chills and fever.  HENT: Negative for congestion and rhinorrhea.   Eyes: Negative for redness and visual disturbance.  Respiratory: Negative for shortness of breath and wheezing.   Cardiovascular: Negative for chest pain and palpitations.  Gastrointestinal: Positive for abdominal pain and constipation. Negative for nausea and vomiting.  Genitourinary: Negative for dysuria and urgency.  Musculoskeletal: Negative for arthralgias and myalgias.  Skin: Negative for pallor and wound.  Neurological: Negative for dizziness and headaches.   Physical Exam Updated Vital Signs BP 118/89 (BP Location: Right Arm)   Pulse 96   Temp 99.8 F (37.7 C) (Oral)  Resp 20   SpO2 100%   Physical Exam  Constitutional: She is oriented to person, place, and time. She appears well-developed and well-nourished. No distress.  HENT:  Head: Normocephalic and atraumatic.  Eyes: EOM are normal. Pupils are equal, round, and reactive to light.  Neck: Normal range of motion. Neck supple.  Cardiovascular: Normal rate and regular rhythm.  Exam reveals no gallop and no friction rub.   No murmur  heard. Pulmonary/Chest: Effort normal. She has no wheezes. She has no rales.  Abdominal: Soft. She exhibits no distension. There is no tenderness.  Benign abdominal pain.   Musculoskeletal: She exhibits no edema or tenderness.  Neurological: She is alert and oriented to person, place, and time.  Skin: Skin is warm and dry. She is not diaphoretic.  Psychiatric: She has a normal mood and affect. Her behavior is normal.  Nursing note and vitals reviewed.  ED Treatments / Results  DIAGNOSTIC STUDIES:  Oxygen Saturation is 100% on RA, normal by my interpretation.    COORDINATION OF CARE:  8:38 PM Discussed treatment plan with pt at bedside and pt agreed to plan.  Labs (all labs ordered are listed, but only abnormal results are displayed) Labs Reviewed  PREGNANCY, URINE  URINALYSIS, ROUTINE W REFLEX MICROSCOPIC    EKG  EKG Interpretation None       Radiology No results found.  Procedures Procedures (including critical care time)  Medications Ordered in ED Medications - No data to display   Initial Impression / Assessment and Plan / ED Course  I have reviewed the triage vital signs and the nursing notes.  Pertinent labs & imaging results that were available during my care of the patient were reviewed by me and considered in my medical decision making (see chart for details).     41 yo F With a chief complaint of diffuse abdominal pain. Worse in the left lower quadrant. Pain is colicky headaches or double over and then improves over a minute or 2. Has a history of constipation due to her being on Suboxone. On my exam the patient is well-appearing and nontoxic. No pain on my exam. Clinically sounds like constipation. We'll have the patient trial a clean out. Strict return precautions.  11:21 PM:  I have discussed the diagnosis/risks/treatment options with the patient and believe the pt to be eligible for discharge home to follow-up with PCP. We also discussed returning to  the ED immediately if new or worsening sx occur. We discussed the sx which are most concerning (e.g., sudden worsening pain, fever, inability to tolerate by mouth) that necessitate immediate return. Medications administered to the patient during their visit and any new prescriptions provided to the patient are listed below.  Medications given during this visit Medications - No data to display   The patient appears reasonably screen and/or stabilized for discharge and I doubt any other medical condition or other [facility] requiring further screening, evaluation, or treatment in the ED at this time prior to discharge.    Final Clinical Impressions(s) / ED Diagnoses   Final diagnoses:  Left lower quadrant pain    New Prescriptions Discharge Medication List as of 03/03/2017  8:42 PM    I personally performed the services described in this documentation, which was scribed in my presence. The recorded information has been reviewed and is accurate.      Melene Plan, DO 03/03/17 2321

## 2017-03-03 NOTE — ED Notes (Signed)
Pt called to treatment room. Nurse first states pt has "stepped outside." pt will be brought back to room when she returns.

## 2017-03-03 NOTE — Discharge Instructions (Signed)
TAKE 8 CAPFULS OF MIRALAX IN A 32 OUNCE GATORADE AND DRINK THE WHOLE BEVERAGE  ° °

## 2017-03-04 ENCOUNTER — Encounter (HOSPITAL_BASED_OUTPATIENT_CLINIC_OR_DEPARTMENT_OTHER): Payer: Self-pay

## 2017-03-04 ENCOUNTER — Emergency Department (HOSPITAL_BASED_OUTPATIENT_CLINIC_OR_DEPARTMENT_OTHER)
Admission: EM | Admit: 2017-03-04 | Discharge: 2017-03-05 | Disposition: A | Discharge status: Home / Self Care | Payer: Medicaid Other | Attending: Emergency Medicine | Admitting: Emergency Medicine

## 2017-03-04 DIAGNOSIS — Z79899 Other long term (current) drug therapy: Secondary | ICD-10-CM | POA: Insufficient documentation

## 2017-03-04 DIAGNOSIS — R1012 Left upper quadrant pain: Secondary | ICD-10-CM | POA: Diagnosis present

## 2017-03-04 DIAGNOSIS — K529 Noninfective gastroenteritis and colitis, unspecified: Secondary | ICD-10-CM | POA: Insufficient documentation

## 2017-03-04 DIAGNOSIS — F17200 Nicotine dependence, unspecified, uncomplicated: Secondary | ICD-10-CM | POA: Diagnosis not present

## 2017-03-04 NOTE — ED Provider Notes (Signed)
[facility]   CSN: [id number] Arrival date & time: 03/04/17  2157   By signing my name below, I, [name], attest that this documentation has been prepared under the direction and in the presence of Ivylynn Hoppes, PA-C. Electronically Signed: [name], Scribe. 03/04/17. 12:24 AM.   History   Chief Complaint Chief Complaint  Patient presents with  . Abdominal Pain   The history is provided by the patient and medical records. No language interpreter was used.    Christina Stokes is a 41 y.o. female with h/o opioid abuse and tubal ligation, who presents to the Emergency Department with concern for worsened LUQ pain yesterday from onset ~4 days ago. Decreased appetite and bloody stools noted. Pt seen for constipation and abdominal pain 03/03/2017 by Melene Plan, DO in [facility] ED. No lab work or imaging performed at the time. She states she was prescribed miralax and Bentyl and notes she has also took an enema yesterday. Reports chronic constipation due to Suboxone use. She reports 3 BM's yesterday with some bloody stools mixed in not described as bright red. She describes moderate to severe pain worse with movement and deep breathing, which is the reason she is here today. No other modifying factors noted. H/o tubal ligation noted. Pt states she often takes goody powders for unrelated issues. No dysuria, hematuria, vaginal bleeding, vaginal discharge, chest pain, SOB, fever, recent trauma or injury, h/o any other abdominal surgeries or any other complaints noted at this time.   Past Medical History:  Diagnosis Date  . Anxiety   . Back pain   . GERD (gastroesophageal reflux disease)   . Opiate addiction (HCC)   . Opioid dependence on agonist therapy (HCC)     There are no active problems to display for this patient.   Past Surgical History:  Procedure Laterality Date  . LEG SURGERY    . TUBAL LIGATION      OB History    No data available       Home  Medications    Prior to Admission medications   Medication Sig Start Date End Date Taking? Authorizing Provider  Aspirin-Acetaminophen-Caffeine (GOODY HEADACHE PO) Take by mouth.   Yes Historical Provider, MD  Citalopram Hydrobromide (CELEXA PO) Take by mouth.   Yes Historical Provider, MD  LISINOPRIL PO Take by mouth.   Yes Historical Provider, MD  buprenorphine-naloxone (SUBOXONE) 8-2 mg SUBL SL tablet Place 1 tablet under the tongue 2 (two) times daily.    Historical Provider, MD  ciprofloxacin (CIPRO) 500 MG tablet Take 1 tablet (500 mg total) by mouth 2 (two) times daily. One po bid x 7 days 03/05/17   Geoffery Lyons, MD  cyclobenzaprine (FLEXERIL) 10 MG tablet Take 1 tablet (10 mg total) by mouth 2 (two) times daily as needed for muscle spasms. 04/15/16   Alvira Monday, MD  dicyclomine (BENTYL) 20 MG tablet Take 1 tablet (20 mg total) by mouth 2 (two) times daily. 03/03/17   Melene Plan, DO  metroNIDAZOLE (FLAGYL) 500 MG tablet Take 1 tablet (500 mg total) by mouth 3 (three) times daily. One po bid x 7 days 03/05/17   Geoffery Lyons, MD    Family History No family history on file.  Social History Social History  Substance Use Topics  . Smoking status: Current Every Day Smoker    Packs/day: 1.00  . Smokeless tobacco: Never Used  . Alcohol use Yes     Comment: occ     Allergies  Patient has no active allergies.   Review of Systems Review of Systems  Constitutional: Positive for appetite change.  Respiratory: Negative for shortness of breath.   Cardiovascular: Negative for chest pain.  Gastrointestinal: Positive for abdominal pain and blood in stool.  Genitourinary: Negative for dysuria, hematuria, vaginal bleeding and vaginal discharge.  Skin: Negative for wound.  All other systems reviewed and are negative.   Physical Exam Updated Vital Signs BP 110/84 (BP Location: Left Arm)   Pulse (!) 105   Temp 98.6 F (37 C) (Oral)   Resp 18   Ht  (1.626 m)   Wt 202 lb (91.6  kg)   SpO2 99%   BMI 34.67 kg/m   Physical Exam  Constitutional: She appears well-developed and well-nourished. No distress.  HENT:  Head: Normocephalic and atraumatic.  Nose: Nose normal.  Eyes: Conjunctivae and EOM are normal. Left eye exhibits no discharge. No scleral icterus.  Neck: Normal range of motion. Neck supple.  Cardiovascular: Normal rate, regular rhythm, normal heart sounds and intact distal pulses.  Exam reveals no gallop and no friction rub.   No murmur heard. Pulmonary/Chest: Effort normal and breath sounds normal. No respiratory distress.  Abdominal: Soft. Bowel sounds are normal. She exhibits no distension. There is tenderness in the left upper quadrant. There is no rebound, no guarding, no tenderness at McBurney's point and negative Murphy's sign.  Musculoskeletal: Normal range of motion. She exhibits no edema.  Neurological: She is alert. She exhibits normal muscle tone. Coordination normal.  Skin: Skin is warm and dry. No rash noted.  Psychiatric: She has a normal mood and affect.  Nursing note and vitals reviewed.    ED Treatments / Results  DIAGNOSTIC STUDIES: Oxygen Saturation is 99% on RA, NL by my interpretation.    COORDINATION OF CARE: 11:52 PM-Discussed next steps with pt. Pt verbalized understanding and is agreeable with the plan. Will order imaging and medications   Labs (all labs ordered are listed, but only abnormal results are displayed) Labs Reviewed  BASIC METABOLIC PANEL - Abnormal; Notable for the following:       Result Value   Potassium 3.4 (*)    Chloride 100 (*)    Glucose, Bld 105 (*)    Calcium 8.5 (*)    All other components within normal limits  CBC WITH DIFFERENTIAL/PLATELET - Abnormal; Notable for the following:    WBC 10.6 (*)    RBC 3.77 (*)    Hemoglobin 11.9 (*)    All other components within normal limits  LIPASE, BLOOD  OCCULT BLOOD X 1 CARD TO LAB, STOOL  POC OCCULT BLOOD, ED    EKG  EKG Interpretation None        Radiology Dg Abdomen 1 View  Result Date: 03/05/2017 CLINICAL DATA:  Left-sided abdominal pain for 2 or 3 days. EXAM: ABDOMEN - 1 VIEW COMPARISON:  None. FINDINGS: The bowel gas pattern is normal. No radio-opaque calculi or other significant radiographic abnormality are seen. IMPRESSION: Negative. Electronically Signed   By: Ellery Plunk M.D.   On: 03/05/2017 00:37   Ct Abdomen Pelvis W Contrast  Result Date: 03/05/2017 CLINICAL DATA:  Left lower quadrant pain for 4 days EXAM: CT ABDOMEN AND PELVIS WITH CONTRAST TECHNIQUE: Multidetector CT imaging of the abdomen and pelvis was performed using the standard protocol following bolus administration of intravenous contrast. CONTRAST:  ISOVUE-300 IOPAMIDOL (ISOVUE-300) INJECTION 61% COMPARISON:  None. FINDINGS: Lower chest: No acute abnormality. Hepatobiliary: No focal liver abnormality  is seen. No gallstones, gallbladder wall thickening, or biliary dilatation. Pancreas: Unremarkable. No pancreatic ductal dilatation or surrounding inflammatory changes. Spleen: Normal in size without focal abnormality. Adrenals/Urinary Tract: Adrenal glands are unremarkable. Kidneys are normal, without renal calculi, focal lesion, or hydronephrosis. Bladder is unremarkable. Stomach/Bowel: There is acute inflammation involving a segment of the proximal descending colon. No diverticular disease is evident. The inflammation could represent a focal colitis or ischemia. No bowel obstruction. No extraluminal gas. No other acute inflammatory changes in the abdomen or pelvis. Vascular/Lymphatic: No significant vascular findings are present. No enlarged abdominal or pelvic lymph nodes. Reproductive: Uterus and bilateral adnexa are unremarkable. Other: No ascites Musculoskeletal: No significant skeletal lesion. IMPRESSION: Acute inflammation of the proximal descending colon. No diverticular disease is evident. No obstruction or perforation. Colitis or ischemia are leading  possibilities. No other significant abnormality. Electronically Signed   By: Ellery Plunk M.D.   On: 03/05/2017 04:24    Procedures Procedures (including critical care time)  Medications Ordered in ED Medications  iopamidol (ISOVUE-300) 61 % injection 100 mL (100 mLs Intravenous Contrast Given 03/05/17 0356)     Initial Impression / Assessment and Plan / ED Course  I have reviewed the triage vital signs and the nursing notes.  Pertinent labs & imaging results that were available during my care of the patient were reviewed by me and considered in my medical decision making (see chart for details).     Patient's history and symptoms concerning for constipation vs. Colitis vs. Diverticulitis vs. Appendicitis vs. Gastroenteritis vs. Obstruction vs. Ovarian torsion. Reports relief of constipation with regimen given at last visit. Was told to return if pain worsens.  Concern for colitis or gastroenteritis due to blood in stool and continued pain. Low concern for obstruction considering bowel movements present. CBC showed elevated WBC count at 10.6 Other electrolytes normal. Lipase unremarkable. Upreg done at previous visit negative. UA negative for UTI. Hemoccult negative.  Normal H/H on CBC. Xray showed normal bowel gas pattern and no stool burden.  CT abd/pel ordered to r/o further infectious processes of abdomen and evaluate ovaries. Will dispo accordingly for ovarian torsion, appendicitis, diverticulitis. Will give cipro/flagyl for colitis. If negative, will dispo with return precautions and continuing previous bowel regimen given. Imaging pending at end of shift. Care turned over to Dr. Judd Lien.  Final Clinical Impressions(s) / ED Diagnoses   Final diagnoses:  Colitis    New Prescriptions Discharge Medication List as of 03/05/2017  4:36 AM    START taking these medications   Details  ciprofloxacin (CIPRO) 500 MG tablet Take 1 tablet (500 mg total) by mouth 2 (two) times daily. One  po bid x 7 days, Starting Wed 03/05/2017, Print    metroNIDAZOLE (FLAGYL) 500 MG tablet Take 1 tablet (500 mg total) by mouth 3 (three) times daily. One po bid x 7 days, Starting Wed 03/05/2017, Print       I personally performed the services described in this documentation, which was scribed in my presence. The recorded information has been reviewed and is accurate.     Dietrich Pates, PA-C 03/05/17 1207    Geoffery Lyons, MD 03/05/17 [phone]

## 2017-03-04 NOTE — ED Triage Notes (Signed)
c/o abd pain after BM x 2 today-seen here for same yesterday-NAD-steady gait

## 2017-03-05 ENCOUNTER — Emergency Department (HOSPITAL_BASED_OUTPATIENT_CLINIC_OR_DEPARTMENT_OTHER): Payer: Medicaid Other

## 2017-03-05 LAB — BASIC METABOLIC PANEL
Anion gap: 7 (ref 5–15)
BUN: 8 mg/dL (ref 6–20)
CALCIUM: 8.5 mg/dL — AB (ref 8.9–10.3)
CO2: 29 mmol/L (ref 22–32)
Chloride: 100 mmol/L — ABNORMAL LOW (ref 101–111)
Creatinine, Ser: 0.73 mg/dL (ref 0.44–1.00)
GFR calc non Af Amer: >60 mL/min (ref >60)
Glucose, Bld: 105 mg/dL — ABNORMAL HIGH (ref 65–99)
Potassium: 3.4 mmol/L — ABNORMAL LOW (ref 3.5–5.1)
Sodium: 136 mmol/L (ref 135–145)

## 2017-03-05 LAB — CBC WITH DIFFERENTIAL/PLATELET
BASOS ABS: 0 10*3/uL (ref 0.0–0.1)
BASOS PCT: 0 %
Eosinophils Absolute: 0.1 10*3/uL (ref 0.0–0.7)
Eosinophils Relative: 1 %
HEMATOCRIT: 36 % (ref 36.0–46.0)
Hemoglobin: 11.9 g/dL — ABNORMAL LOW (ref 12.0–15.0)
Lymphocytes Relative: 22 %
Lymphs Abs: 2.3 10*3/uL (ref 0.7–4.0)
MCH: 31.6 pg (ref 26.0–34.0)
MCHC: 33.1 g/dL (ref 30.0–36.0)
MCV: 95.5 fL (ref 78.0–100.0)
Monocytes Absolute: 0.6 10*3/uL (ref 0.1–1.0)
Monocytes Relative: 6 %
NEUTROS ABS: 7.5 10*3/uL (ref 1.7–7.7)
NEUTROS PCT: 71 %
Platelets: 195 10*3/uL (ref 150–400)
RBC: 3.77 MIL/uL — AB (ref 3.87–5.11)
RDW: 13.3 % (ref 11.5–15.5)
WBC: 10.6 10*3/uL — ABNORMAL HIGH (ref 4.0–10.5)

## 2017-03-05 LAB — OCCULT BLOOD X 1 CARD TO LAB, STOOL: FECAL OCCULT BLD: NEGATIVE

## 2017-03-05 LAB — LIPASE, BLOOD: Lipase: 15 U/L (ref 11–51)

## 2017-03-05 MED ORDER — METRONIDAZOLE 500 MG PO TABS: 500.0000 mg | ORAL_TABLET | Freq: Three times a day (TID) | ORAL | 0 refills | Status: DC

## 2017-03-05 MED ORDER — CIPROFLOXACIN HCL 500 MG PO TABS: 500.0000 mg | ORAL_TABLET | Freq: Two times a day (BID) | ORAL | 0 refills | Status: DC

## 2017-03-05 MED ORDER — IOPAMIDOL (ISOVUE-300) INJECTION 61%
100.0000 mL | Freq: Once | INTRAVENOUS | Status: AC | PRN
  Administered 2017-03-05: 100 mL via INTRAVENOUS

## 2017-03-05 NOTE — Discharge Instructions (Signed)
Cipro and Flagyl as prescribed.  Follow-up with [facility] gastroenterology in the next week for a recheck. The contact information for the office has been provided in this discharge summary for you to call and make these arrangements.  Return to the emergency department if you develop worsening pain, high fevers, worsening bleeding, or other new and concerning symptoms.

## 2017-03-05 NOTE — ED Notes (Signed)
Patient transported to X-ray 

## 2017-03-13 DIAGNOSIS — D649 Anemia, unspecified: Secondary | ICD-10-CM | POA: Insufficient documentation

## 2017-03-13 DIAGNOSIS — R109 Unspecified abdominal pain: Secondary | ICD-10-CM | POA: Insufficient documentation

## 2018-02-05 DIAGNOSIS — I10 Essential (primary) hypertension: Secondary | ICD-10-CM | POA: Insufficient documentation

## 2018-02-05 DIAGNOSIS — F1110 Opioid abuse, uncomplicated: Secondary | ICD-10-CM | POA: Insufficient documentation
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
## 2021-07-17 ENCOUNTER — Emergency Department (HOSPITAL_BASED_OUTPATIENT_CLINIC_OR_DEPARTMENT_OTHER): Payer: Medicaid Other

## 2021-07-17 ENCOUNTER — Encounter (HOSPITAL_BASED_OUTPATIENT_CLINIC_OR_DEPARTMENT_OTHER): Payer: Self-pay | Admitting: *Deleted

## 2021-07-17 ENCOUNTER — Other Ambulatory Visit: Payer: Self-pay

## 2021-07-17 DIAGNOSIS — Z7982 Long term (current) use of aspirin: Secondary | ICD-10-CM | POA: Insufficient documentation

## 2021-07-17 DIAGNOSIS — Z87891 Personal history of nicotine dependence: Secondary | ICD-10-CM | POA: Insufficient documentation

## 2021-07-17 DIAGNOSIS — R079 Chest pain, unspecified: Secondary | ICD-10-CM | POA: Diagnosis not present

## 2021-07-17 LAB — CBC
HCT: 39.3 % (ref 36.0–46.0)
Hemoglobin: 13 g/dL (ref 12.0–15.0)
MCH: 29.4 pg (ref 26.0–34.0)
MCHC: 33.1 g/dL (ref 30.0–36.0)
MCV: 88.9 fL (ref 80.0–100.0)
Platelets: 196 10*3/uL (ref 150–400)
RBC: 4.42 MIL/uL (ref 3.87–5.11)
RDW: 13.4 % (ref 11.5–15.5)
WBC: 4.2 10*3/uL (ref 4.0–10.5)
nRBC: 0 % (ref 0.0–0.2)

## 2021-07-17 LAB — BASIC METABOLIC PANEL
Anion gap: 8 (ref 5–15)
BUN: 11 mg/dL (ref 6–20)
CO2: 28 mmol/L (ref 22–32)
Calcium: 8.9 mg/dL (ref 8.9–10.3)
Chloride: 102 mmol/L (ref 98–111)
Creatinine, Ser: 0.84 mg/dL (ref 0.44–1.00)
GFR, Estimated: >60 mL/min (ref >60)
Glucose, Bld: 104 mg/dL — ABNORMAL HIGH (ref 70–99)
Potassium: 3.8 mmol/L (ref 3.5–5.1)
Sodium: 138 mmol/L (ref 135–145)

## 2021-07-17 LAB — TROPONIN I (HIGH SENSITIVITY): Troponin I (High Sensitivity): <2 ng/L (ref <18)

## 2021-07-17 LAB — PREGNANCY, URINE: Preg Test, Ur: NEGATIVE

## 2021-07-17 NOTE — ED Triage Notes (Signed)
C/o  left sided chest pain / shoulder pain throughout  the day , denies SOB, nausea.

## 2021-07-18 ENCOUNTER — Emergency Department (HOSPITAL_BASED_OUTPATIENT_CLINIC_OR_DEPARTMENT_OTHER)
Admission: EM | Admit: 2021-07-18 | Discharge: 2021-07-18 | Disposition: A | Discharge status: Home / Self Care | Payer: Medicaid Other | Attending: Emergency Medicine | Admitting: Emergency Medicine

## 2021-07-18 DIAGNOSIS — R072 Precordial pain: Secondary | ICD-10-CM

## 2021-07-18 LAB — TROPONIN I (HIGH SENSITIVITY): Troponin I (High Sensitivity): <2 ng/L (ref <18)

## 2021-07-18 NOTE — ED Provider Notes (Signed)
[facility] EMERGENCY DEPARTMENT Provider Note   CSN: [id number] Arrival date & time: 07/17/21  2236     History Chief Complaint  Patient presents with   Chest Pain    Christina Stokes is a 45 y.o. female.  The history is provided by the patient.  Chest Pain Pain location:  L chest Pain quality: not aching   Radiates to: also left shoulder pain but not contiguous and not at the same time. Pain severity:  Moderate Onset quality:  Gradual Duration:  1 day Timing:  Intermittent Progression:  Unchanged Chronicity:  New Context: at rest   Relieved by:  Nothing Worsened by:  Nothing Ineffective treatments:  None tried Associated symptoms: no abdominal pain, no AICD problem, no altered mental status, no anorexia, no anxiety, no back pain, no claudication, no cough, no diaphoresis, no dizziness, no dysphagia, no fatigue, no fever, no headache, no heartburn, no lower extremity edema, no nausea, no near-syncope, no numbness, no orthopnea, no palpitations, no PND, no shortness of breath, no syncope, no vomiting and no weakness   Risk factors: no aortic disease   No travel, no leg pain, no OCP.  No exertional symptoms.      Past Medical History:  Diagnosis Date   Anxiety    Back pain    GERD (gastroesophageal reflux disease)    Opiate addiction (HCC)    Opioid dependence on agonist therapy (HCC)     There are no problems to display for this patient.   Past Surgical History:  Procedure Laterality Date   LEG SURGERY     TUBAL LIGATION       OB History   No obstetric history on file.     History reviewed. No pertinent family history.  Social History   Tobacco Use   Smoking status: Former    Packs/day: 1.00    Types: Cigarettes    Quit date: 07/10/2021    Years since quitting: 0.0   Smokeless tobacco: Never  Vaping Use   Vaping Use: Never used  Substance Use Topics   Alcohol use: Yes    Comment: occ   Drug use: No    Home Medications Prior to  Admission medications   Medication Sig Start Date End Date Taking? Authorizing Provider  Aspirin-Acetaminophen-Caffeine (GOODY HEADACHE PO) Take by mouth.    [provider]  buprenorphine-naloxone (SUBOXONE) 8-2 mg SUBL SL tablet Place 1 tablet under the tongue 2 (two) times daily.    [provider]  ciprofloxacin (CIPRO) 500 MG tablet Take 1 tablet (500 mg total) by mouth 2 (two) times daily. One po bid x 7 days 03/05/17   Geoffery Lyons, MD  Citalopram Hydrobromide (CELEXA PO) Take by mouth.    [provider]  cyclobenzaprine (FLEXERIL) 10 MG tablet Take 1 tablet (10 mg total) by mouth 2 (two) times daily as needed for muscle spasms. 04/15/16   Alvira Monday, MD  dicyclomine (BENTYL) 20 MG tablet Take 1 tablet (20 mg total) by mouth 2 (two) times daily. 03/03/17   Melene Plan, DO  LISINOPRIL PO Take by mouth.    [provider]  metroNIDAZOLE (FLAGYL) 500 MG tablet Take 1 tablet (500 mg total) by mouth 3 (three) times daily. One po bid x 7 days 03/05/17   Geoffery Lyons, MD    Allergies    Shellfish-derived products, Codeine, and Shellfish allergy  Review of Systems   Review of Systems  Constitutional:  Negative for diaphoresis, fatigue and fever.  HENT:  Negative for facial swelling and trouble swallowing.   Eyes:  Negative for redness.  Respiratory:  Negative for cough and shortness of breath.   Cardiovascular:  Positive for chest pain. Negative for palpitations, orthopnea, claudication, syncope, PND and near-syncope.  Gastrointestinal:  Negative for abdominal pain, anorexia, heartburn, nausea and vomiting.  Genitourinary:  Negative for difficulty urinating.  Musculoskeletal:  Negative for back pain.  Skin:  Negative for rash.  Neurological:  Negative for dizziness, weakness, numbness and headaches.  Psychiatric/Behavioral:  Negative for agitation.   All other systems reviewed and are negative.  Physical Exam Updated Vital Signs BP (!) 138/94 (BP  Location: Right Arm)   Pulse 71   Temp 99 F (37.2 C) (Oral)   Resp 12   Ht 5' 3.5" (1.613 m)   Wt 81.6 kg   LMP 07/10/2021   SpO2 97%   BMI 31.39 kg/m   Physical Exam Vitals and nursing note reviewed.  Constitutional:      General: She is not in acute distress.    Appearance: Normal appearance.  HENT:     Head: Normocephalic and atraumatic.     Nose: Nose normal.  Eyes:     Conjunctiva/sclera: Conjunctivae normal.     Pupils: Pupils are equal, round, and reactive to light.  Cardiovascular:     Rate and Rhythm: Normal rate and regular rhythm.     Pulses: Normal pulses.     Heart sounds: Normal heart sounds.  Pulmonary:     Effort: Pulmonary effort is normal.     Breath sounds: Normal breath sounds.  Abdominal:     General: Abdomen is flat. Bowel sounds are normal.     Palpations: Abdomen is soft.     Tenderness: There is no abdominal tenderness. There is no guarding.  Musculoskeletal:        General: No swelling or tenderness. Normal range of motion.     Cervical back: Normal range of motion and neck supple.     Right lower leg: No edema.     Left lower leg: No edema.  Skin:    General: Skin is warm and dry.     Capillary Refill: Capillary refill takes less than 2 seconds.  Neurological:     General: No focal deficit present.     Mental Status: She is alert and oriented to person, place, and time.     Deep Tendon Reflexes: Reflexes normal.  Psychiatric:        Mood and Affect: Mood normal.        Behavior: Behavior normal.    ED Results / Procedures / Treatments   Labs (all labs ordered are listed, but only abnormal results are displayed) Results for orders placed or performed during the hospital encounter of 07/18/21  Basic metabolic panel  Result Value Ref Range   Sodium 138 135 - 145 mmol/L   Potassium 3.8 3.5 - 5.1 mmol/L   Chloride 102 98 - 111 mmol/L   CO2 28 22 - 32 mmol/L   Glucose, Bld 104 (H) 70 - 99 mg/dL   BUN 11 6 - 20 mg/dL   Creatinine,  Ser 1.75 0.44 - 1.00 mg/dL   Calcium 8.9 8.9 - 10.2 mg/dL   GFR, Estimated >58 >52 mL/min   Anion gap 8 5 - 15  CBC  Result Value Ref Range   WBC 4.2 4.0 - 10.5 K/uL   RBC 4.42 3.87 - 5.11 MIL/uL   Hemoglobin 13.0 12.0 - 15.0 g/dL  HCT 39.3 36.0 - 46.0 %   MCV 88.9 80.0 - 100.0 fL   MCH 29.4 26.0 - 34.0 pg   MCHC 33.1 30.0 - 36.0 g/dL   RDW 60.7 37.1 - 06.2 %   Platelets 196 150 - 400 K/uL   nRBC 0.0 0.0 - 0.2 %  Pregnancy, urine  Result Value Ref Range   Preg Test, Ur NEGATIVE NEGATIVE  Troponin I (High Sensitivity)  Result Value Ref Range   Troponin I (High Sensitivity) <2 <18 ng/L  Troponin I (High Sensitivity)  Result Value Ref Range   Troponin I (High Sensitivity) <2 <18 ng/L   DG Chest 2 View  Result Date: 07/17/2021 CLINICAL DATA:  Left-sided chest and shoulder pain EXAM: CHEST - 2 VIEW COMPARISON:  04/20/2016 FINDINGS: The heart size and mediastinal contours are within normal limits. Both lungs are clear. The visualized skeletal structures are unremarkable. IMPRESSION: No active cardiopulmonary disease. Electronically Signed   By: Sharlet Salina M.D.   On: 07/17/2021 22:57    EKG EKG Interpretation  Date/Time:  Tuesday July 17 2021 22:44:44 EDT Ventricular Rate:  95 PR Interval:  170 QRS Duration: 82 QT Interval:  358 QTC Calculation: 449 R Axis:   65 Text Interpretation: Normal sinus rhythm Low voltage QRS Borderline ECG No old tracing to compare Confirmed by Susy Frizzle [phone]) on 07/17/2021 10:55:47 PM  Radiology DG Chest 2 View  Result Date: 07/17/2021 CLINICAL DATA:  Left-sided chest and shoulder pain EXAM: CHEST - 2 VIEW COMPARISON:  04/20/2016 FINDINGS: The heart size and mediastinal contours are within normal limits. Both lungs are clear. The visualized skeletal structures are unremarkable. IMPRESSION: No active cardiopulmonary disease. Electronically Signed   By: Sharlet Salina M.D.   On: 07/17/2021 22:57    Procedures Procedures    Medications Ordered in ED Medications - No data to display  ED Course  I have reviewed the triage vital signs and the nursing notes.  Pertinent labs & imaging results that were available during my care of the patient were reviewed by me and considered in my medical decision making (see chart for details). Pain appears to be MSk in nature.  PERC negative wells 0, highly doubt PE in this kow risk patient   Christina Stokes was evaluated in Emergency Department on 07/18/2021 for the symptoms described in the history of present illness. She was evaluated in the context of the global COVID-19 pandemic, which necessitated consideration that the patient might be at risk for infection with the SARS-CoV-2 virus that causes COVID-19. Institutional protocols and algorithms that pertain to the evaluation of patients at risk for COVID-19 are in a state of rapid change based on information released by regulatory bodies including the CDC and federal and state organizations. These policies and algorithms were followed during the patient's care in the ED.  Final Clinical Impression(s) / ED Diagnoses Final diagnoses:  None     Return for intractable cough, coughing up blood, fevers > 100.4 unrelieved by medication, shortness of breath, intractable vomiting, chest pain, shortness of breath, weakness, numbness, changes in speech, facial asymmetry, abdominal pain, passing out, Inability to tolerate liquids or food, cough, altered mental status or any concerns. No signs of systemic illness or infection. The patient is nontoxic-appearing on exam and vital signs are within normal limits. I have reviewed the triage vital signs and the nursing notes. Pertinent labs & imaging results that were available during my care of the patient were reviewed by me and considered in my  medical decision making (see chart for details). After history, exam, and medical workup I feel the patient has been appropriately medically screened and  is safe for discharge home. Pertinent diagnoses were discussed with the patient. Patient was given return precautions.   Rx / DC Orders ED Discharge Orders     None        Jceon Alverio, MD 07/18/21 [phone]

## 2021-11-25 DIAGNOSIS — F05 Delirium due to known physiological condition: Secondary | ICD-10-CM | POA: Insufficient documentation

## 2021-12-18 ENCOUNTER — Other Ambulatory Visit (HOSPITAL_BASED_OUTPATIENT_CLINIC_OR_DEPARTMENT_OTHER): Payer: Self-pay

## 2021-12-18 ENCOUNTER — Encounter (HOSPITAL_BASED_OUTPATIENT_CLINIC_OR_DEPARTMENT_OTHER): Payer: Self-pay

## 2021-12-18 ENCOUNTER — Emergency Department (HOSPITAL_BASED_OUTPATIENT_CLINIC_OR_DEPARTMENT_OTHER)
Admission: EM | Admit: 2021-12-18 | Discharge: 2021-12-18 | Disposition: A | Discharge status: Home / Self Care | Payer: Medicaid Other | Attending: Emergency Medicine | Admitting: Emergency Medicine

## 2021-12-18 ENCOUNTER — Other Ambulatory Visit: Payer: Self-pay

## 2021-12-18 DIAGNOSIS — G2581 Restless legs syndrome: Secondary | ICD-10-CM | POA: Insufficient documentation

## 2021-12-18 DIAGNOSIS — Z87891 Personal history of nicotine dependence: Secondary | ICD-10-CM | POA: Diagnosis not present

## 2021-12-18 MED ORDER — CLONAZEPAM 1 MG PO TBDP: 1.0000 mg | ORAL_TABLET | Freq: Every evening | ORAL | 0 refills | Status: DC | PRN

## 2021-12-18 MED ORDER — CLONAZEPAM 1 MG PO TBDP
1.0000 mg | ORAL_TABLET | Freq: Every evening | ORAL | 0 refills | Status: DC | PRN
  Filled 2021-12-18: qty 5, 5d supply, fill #0

## 2021-12-18 NOTE — ED Provider Notes (Signed)
[place] DEPT [facility] Provider Note: Georgena Spurling, MD, FACEP  CSN: [id number] MRN: [id number] ARRIVAL: 12/18/21 at [place]: [place]  Withdrawal (Restless leg)   HISTORY OF PRESENT ILLNESS  12/18/21 5:21 AM Christina Stokes is a 46 y.o. female with a history of opioid addiction who had been on Suboxone therapy prescribed by her primary care physician.  She got her last prescription for 30 tablets filled on 11/23/2021.  She chose to get off of chronic narcotic therapy and disposed of her Suboxone 19 days ago.  She has also given up taking clonazepam.  Unfortunately as a result of discontinuing these drugs she has developed restless legs syndrome.  These are almost uncontrollable movements or the feeling of the need to move her legs.  It is worse when she is trying to sleep and it is causing her to lose sleep.  She was given a prescription for Requip 0.25 mg 3 times daily but threw the prescription away after she read some Internet information warning about side effects such as compulsive gambling or abnormal sexual behavior.  She has a refill awaiting her at her pharmacy.  She is requesting something to help her sleep tonight until she can get her Requip refilled.  She does not wish any Suboxone.   Past Medical History:  Diagnosis Date   Anxiety    Back pain    GERD (gastroesophageal reflux disease)    Opiate addiction ([place])    Opioid dependence on agonist therapy [facility])     Past Surgical History:  Procedure Laterality Date   LEG SURGERY     TUBAL LIGATION      History reviewed. No pertinent family history.  Social History   Tobacco Use   Smoking status: Former    Packs/day: 1.00    Types: Cigarettes    Quit date: 07/10/2021    Years since quitting: 0.4   Smokeless tobacco: Never  Vaping Use   Vaping Use: Never used  Substance Use Topics   Alcohol use: Yes    Comment: occ   Drug use: No    Prior to Admission medications   Medication Sig Start  Date End Date Taking? Authorizing Provider  clonazePAM (KLONOPIN) 1 MG disintegrating tablet Take 1 tablet (1 mg total) by mouth at bedtime as needed (For sleep or restless legs). 12/18/21  Yes Cordae Mccarey, MD  Aspirin-Acetaminophen-Caffeine (GOODY HEADACHE PO) Take by mouth.    [provider]  Citalopram Hydrobromide (CELEXA PO) Take by mouth.    [provider]  LISINOPRIL PO Take by mouth.    [provider]    Allergies Shellfish-derived products, Codeine, and Shellfish allergy   REVIEW OF SYSTEMS  Negative except as noted here or in the History of Present Illness.   PHYSICAL EXAMINATION  Initial Vital Signs Blood pressure 132/88, pulse 84, temperature 98 F (36.7 C), temperature source Oral, resp. rate 16, height 5\' 3"  (1.6 m), weight 81.2 kg, last menstrual period 11/23/2021, SpO2 100 %.  Examination General: Well-developed, well-nourished female in no acute distress; appearance consistent with age of record HENT: normocephalic; atraumatic Eyes: Normal appearance Neck: supple Heart: regular rate and rhythm Lungs: clear to auscultation bilaterally Abdomen: soft; nondistended; nontender; bowel sounds present Extremities: No deformity; full range of motion Neurologic: Awake, alert and oriented; motor function intact in all extremities and symmetric; no facial droop Skin: Warm and dry Psychiatric: Normal mood and affect   RESULTS  Summary of this visit's results, reviewed and  interpreted by myself:   EKG Interpretation  Date/Time:    Ventricular Rate:    PR Interval:    QRS Duration:   QT Interval:    QTC Calculation:   R Axis:     Text Interpretation:         Laboratory Studies: No results found for this or any previous visit (from the past 24 hour(s)). Imaging Studies: No results found.  ED COURSE and MDM  Nursing notes, initial and subsequent vitals signs, including pulse oximetry, reviewed and interpreted by myself.  Vitals:    12/18/21 0515 12/18/21 0520  BP:  132/88  Pulse:  84  Resp:  16  Temp:  98 F (36.7 C)  TempSrc:  Oral  SpO2:  100%  Weight: 81.2 kg   Height: 5\' 3"  (1.6 m)    Medications - No data to display  The patient was interviewed at length and gave me no impression of drug-seeking behavior.  She is willing to try a brief course of clonazepam pending refilling her Requip.  She was reassured that compulsive gambling and abnormal sexual behaviors are very rare side effects of Requip and usually resolve quickly with discontinuation of the Requip.  She was also advised that by tapering the Requip up slowly (0.25 mg the minimal dose and the maximum dose of Requip can be as much as 4 mg) she should be able to control her symptoms while able to monitor any abnormal thoughts about gambling, sex or other undesirable side effects.  PROCEDURES  Procedures   ED DIAGNOSES     ICD-10-CM   1. Restless legs syndrome  G25.81          Kamilah Correia, Jenny Reichmann, MD 12/18/21 [phone]

## 2021-12-18 NOTE — ED Notes (Signed)
Patient discharged to home.  All discharge instructions reviewed.  Patient verbalized understanding via teachback method.  VS WDL.  Respirations even and unlabored.  Ambulatory out of ED.   °

## 2021-12-18 NOTE — ED Triage Notes (Signed)
Patient states she has stopped taking her suboxone and has been off for 19 days.  Since she stopped taking it, she has been suffering from restless legs and has been unable to sleep.  Had a prescription for requip, but only took for 2 days.  Last dose 4-5 days ago.

## 2022-12-11 DIAGNOSIS — D509 Iron deficiency anemia, unspecified: Secondary | ICD-10-CM | POA: Insufficient documentation

## 2023-01-01 DIAGNOSIS — F5104 Psychophysiologic insomnia: Secondary | ICD-10-CM | POA: Insufficient documentation

## 2023-01-01 DIAGNOSIS — G2581 Restless legs syndrome: Secondary | ICD-10-CM | POA: Insufficient documentation

## 2023-01-27 LAB — EXTERNAL GENERIC LAB PROCEDURE: COLOGUARD: NEGATIVE

## 2023-01-27 LAB — COLOGUARD: COLOGUARD: NEGATIVE

## 2023-08-08 ENCOUNTER — Emergency Department (HOSPITAL_COMMUNITY): Payer: Medicaid Other

## 2023-08-08 ENCOUNTER — Inpatient Hospital Stay (HOSPITAL_COMMUNITY)
Admission: EM | Admit: 2023-08-08 | Discharge: 2023-08-14 | DRG: 917 | Disposition: A | Discharge status: Other Acute Inpatient Hospital | Payer: Medicaid Other | Attending: Internal Medicine | Admitting: Internal Medicine

## 2023-08-08 ENCOUNTER — Encounter (HOSPITAL_COMMUNITY): Payer: Self-pay | Admitting: Emergency Medicine

## 2023-08-08 DIAGNOSIS — K219 Gastro-esophageal reflux disease without esophagitis: Secondary | ICD-10-CM | POA: Diagnosis present

## 2023-08-08 DIAGNOSIS — R569 Unspecified convulsions: Secondary | ICD-10-CM | POA: Diagnosis present

## 2023-08-08 DIAGNOSIS — R338 Other retention of urine: Secondary | ICD-10-CM

## 2023-08-08 DIAGNOSIS — Z5900 Homelessness unspecified: Secondary | ICD-10-CM

## 2023-08-08 DIAGNOSIS — T481X2A Poisoning by skeletal muscle relaxants [neuromuscular blocking agents], intentional self-harm, initial encounter: Principal | ICD-10-CM | POA: Diagnosis present

## 2023-08-08 DIAGNOSIS — R339 Retention of urine, unspecified: Secondary | ICD-10-CM | POA: Diagnosis present

## 2023-08-08 DIAGNOSIS — F1510 Other stimulant abuse, uncomplicated: Secondary | ICD-10-CM | POA: Diagnosis present

## 2023-08-08 DIAGNOSIS — Z91148 Patient's other noncompliance with medication regimen for other reason: Secondary | ICD-10-CM

## 2023-08-08 DIAGNOSIS — Z79899 Other long term (current) drug therapy: Secondary | ICD-10-CM

## 2023-08-08 DIAGNOSIS — F319 Bipolar disorder, unspecified: Secondary | ICD-10-CM | POA: Diagnosis present

## 2023-08-08 DIAGNOSIS — Z91013 Allergy to seafood: Secondary | ICD-10-CM

## 2023-08-08 DIAGNOSIS — T50901A Poisoning by unspecified drugs, medicaments and biological substances, accidental (unintentional), initial encounter: Secondary | ICD-10-CM | POA: Diagnosis present

## 2023-08-08 DIAGNOSIS — T1491XA Suicide attempt, initial encounter: Secondary | ICD-10-CM

## 2023-08-08 DIAGNOSIS — Z560 Unemployment, unspecified: Secondary | ICD-10-CM

## 2023-08-08 DIAGNOSIS — F17210 Nicotine dependence, cigarettes, uncomplicated: Secondary | ICD-10-CM | POA: Diagnosis present

## 2023-08-08 DIAGNOSIS — G929 Unspecified toxic encephalopathy: Secondary | ICD-10-CM | POA: Diagnosis present

## 2023-08-08 DIAGNOSIS — E876 Hypokalemia: Secondary | ICD-10-CM | POA: Diagnosis present

## 2023-08-08 DIAGNOSIS — F1910 Other psychoactive substance abuse, uncomplicated: Principal | ICD-10-CM

## 2023-08-08 DIAGNOSIS — Z888 Allergy status to other drugs, medicaments and biological substances status: Secondary | ICD-10-CM

## 2023-08-08 DIAGNOSIS — Z885 Allergy status to narcotic agent status: Secondary | ICD-10-CM

## 2023-08-08 DIAGNOSIS — F419 Anxiety disorder, unspecified: Secondary | ICD-10-CM | POA: Diagnosis present

## 2023-08-08 DIAGNOSIS — F39 Unspecified mood [affective] disorder: Secondary | ICD-10-CM | POA: Diagnosis present

## 2023-08-08 HISTORY — DX: Major depressive disorder, single episode, unspecified: F32.9

## 2023-08-08 HISTORY — DX: Tobacco use: Z72.0

## 2023-08-08 HISTORY — DX: Other stimulant abuse, uncomplicated: F15.10

## 2023-08-08 LAB — COMPREHENSIVE METABOLIC PANEL
ALT: 29 U/L (ref 0–44)
AST: 26 U/L (ref 15–41)
Albumin: 3.8 g/dL (ref 3.5–5.0)
Alkaline Phosphatase: 67 U/L (ref 38–126)
Anion gap: 13 (ref 5–15)
BUN: 14 mg/dL (ref 6–20)
CO2: 23 mmol/L (ref 22–32)
Calcium: 8.4 mg/dL — ABNORMAL LOW (ref 8.9–10.3)
Chloride: 101 mmol/L (ref 98–111)
Creatinine, Ser: 0.72 mg/dL (ref 0.44–1.00)
GFR, Estimated: >60 mL/min (ref >60)
Glucose, Bld: 137 mg/dL — ABNORMAL HIGH (ref 70–99)
Potassium: 2.5 mmol/L — CL (ref 3.5–5.1)
Sodium: 137 mmol/L (ref 135–145)
Total Bilirubin: 0.8 mg/dL (ref 0.3–1.2)
Total Protein: 6.7 g/dL (ref 6.5–8.1)

## 2023-08-08 LAB — CBC
HCT: 38.1 % (ref 36.0–46.0)
Hemoglobin: 12.4 g/dL (ref 12.0–15.0)
MCH: 29.8 pg (ref 26.0–34.0)
MCHC: 32.5 g/dL (ref 30.0–36.0)
MCV: 91.6 fL (ref 80.0–100.0)
Platelets: 216 10*3/uL (ref 150–400)
RBC: 4.16 MIL/uL (ref 3.87–5.11)
RDW: 12.7 % (ref 11.5–15.5)
WBC: 5.2 10*3/uL (ref 4.0–10.5)
nRBC: 0 % (ref 0.0–0.2)

## 2023-08-08 LAB — I-STAT CHEM 8, ED
BUN: 14 mg/dL (ref 6–20)
Calcium, Ion: 1.12 mmol/L — ABNORMAL LOW (ref 1.15–1.40)
Chloride: 100 mmol/L (ref 98–111)
Creatinine, Ser: 0.6 mg/dL (ref 0.44–1.00)
Glucose, Bld: 136 mg/dL — ABNORMAL HIGH (ref 70–99)
HCT: 38 % (ref 36.0–46.0)
Hemoglobin: 12.9 g/dL (ref 12.0–15.0)
Potassium: 2.5 mmol/L — CL (ref 3.5–5.1)
Sodium: 140 mmol/L (ref 135–145)
TCO2: 27 mmol/L (ref 22–32)

## 2023-08-08 LAB — SALICYLATE LEVEL: Salicylate Lvl: <7 mg/dL — ABNORMAL LOW (ref 7.0–30.0)

## 2023-08-08 LAB — MAGNESIUM: Magnesium: 2 mg/dL (ref 1.7–2.4)

## 2023-08-08 LAB — ETHANOL: Alcohol, Ethyl (B): <10 mg/dL (ref <10)

## 2023-08-08 LAB — ACETAMINOPHEN LEVEL: Acetaminophen (Tylenol), Serum: <10 ug/mL — ABNORMAL LOW (ref 10–30)

## 2023-08-08 LAB — CBG MONITORING, ED: Glucose-Capillary: 134 mg/dL — ABNORMAL HIGH (ref 70–99)

## 2023-08-08 MED ORDER — NALOXONE HCL 0.4 MG/ML IJ SOLN
0.4000 mg | Freq: Once | INTRAMUSCULAR | Status: AC
  Administered 2023-08-08: 0.4 mg via INTRAVENOUS

## 2023-08-08 MED ORDER — POTASSIUM CHLORIDE 10 MEQ/100ML IV SOLN
10.0000 meq | INTRAVENOUS | Status: AC
  Administered 2023-08-09 (×3): 10 meq via INTRAVENOUS
  Filled 2023-08-08 (×3): qty 100

## 2023-08-08 MED ORDER — SODIUM CHLORIDE 0.9 % IV BOLUS
1000.0000 mL | Freq: Once | INTRAVENOUS | Status: AC
  Administered 2023-08-09: 1000 mL via INTRAVENOUS

## 2023-08-08 MED ORDER — NALOXONE HCL 2 MG/2ML IJ SOSY
1.0000 mg | PREFILLED_SYRINGE | Freq: Once | INTRAMUSCULAR | Status: AC
  Administered 2023-08-08: 1 mg via INTRAVENOUS
  Filled 2023-08-08: qty 2

## 2023-08-08 NOTE — ED Triage Notes (Signed)
Pt BIB EMS, pt found by bystander having what looked to be seizure like activity. Upon EMS arrival pt having slight twitching and found with what appears to be a suicide note with an empty bottle of Flexeril. Alert to pain upon arrival to ED.

## 2023-08-08 NOTE — ED Provider Notes (Signed)
[place] EMERGENCY DEPARTMENT AT [facility] Provider Note   CSN: [id number] Arrival date & time: 08/08/23  2250     History {Add pertinent medical, surgical, social history, OB history to HPI:1} Chief Complaint  Patient presents with   Possible Overdose   Unresponsive    Christina Stokes is a 47 y.o. female.  The history is provided by the patient and medical records.   Level 5 caveat: Overdose, altered mental status  47 year old female with history of GERD, anxiety, opiate addiction/dependence,   Home Medications Prior to Admission medications   Medication Sig Start Date End Date Taking? Authorizing Provider  Aspirin-Acetaminophen-Caffeine (GOODY HEADACHE PO) Take by mouth.    [provider]  Citalopram Hydrobromide (CELEXA PO) Take by mouth.    [provider]  clonazePAM (KLONOPIN) 1 MG disintegrating tablet Take 1 tablet (1 mg total) by mouth at bedtime as needed (For sleep or restless legs). 12/18/21   Molpus, Jonny Ruiz, MD  clonazePAM (KLONOPIN) 1 MG disintegrating tablet Take 1 tablet (1 mg total) by mouth at bedtime as needed for sleep or restless leg. 12/18/21   Molpus, Jonny Ruiz, MD  LISINOPRIL PO Take by mouth.    [provider]      Allergies    Shellfish-derived products, Codeine, and Shellfish allergy    Review of Systems   Review of Systems  Unable to perform ROS: Mental status change    Physical Exam Updated Vital Signs BP (!) 145/114   Pulse (!) 115   Temp (!) 97.3 F (36.3 C) (Temporal)   Resp (!) 22   Ht 5\' 3"  (1.6 m)   Wt 81.2 kg   SpO2 98%   BMI 31.71 kg/m   Physical Exam Vitals and nursing note reviewed.  Constitutional:      Appearance: She is well-developed.     Comments: Intermittently moving around on stretcher  HENT:     Head: Normocephalic and atraumatic.     Comments: No visible head trauma    Nose:     Comments: Nasal trumpet right nare Eyes:     Conjunctiva/sclera: Conjunctivae normal.      Pupils: Pupils are equal, round, and reactive to light.  Cardiovascular:     Rate and Rhythm: Normal rate and regular rhythm.     Heart sounds: Normal heart sounds.  Pulmonary:     Effort: Pulmonary effort is normal. No respiratory distress.     Breath sounds: Normal breath sounds. No rhonchi.  Abdominal:     General: Bowel sounds are normal.     Palpations: Abdomen is soft.  Musculoskeletal:        General: Normal range of motion.     Cervical back: Normal range of motion.  Skin:    General: Skin is warm and dry.  Psychiatric:     Comments: Unable to assess     ED Results / Procedures / Treatments   Labs (all labs ordered are listed, but only abnormal results are displayed) Labs Reviewed  CBG MONITORING, ED - Abnormal; Notable for the following components:      Result Value   Glucose-Capillary 134 (*)    All other components within normal limits  CBC  COMPREHENSIVE METABOLIC PANEL  URINALYSIS, ROUTINE W REFLEX MICROSCOPIC  PREGNANCY, URINE  RAPID URINE DRUG SCREEN, HOSP PERFORMED  ACETAMINOPHEN LEVEL  SALICYLATE LEVEL  ETHANOL  I-STAT CHEM 8, ED    EKG None  Radiology No results found.  Procedures Procedures  {Document cardiac monitor, telemetry  assessment procedure when appropriate:1}  Medications Ordered in ED Medications  naloxone (NARCAN) injection 0.4 mg (has no administration in time range)  naloxone [facility]) injection 1 mg (0 mg Intravenous Hold 08/08/23 2258)    ED Course/ Medical Decision Making/ A&P   {   Click here for ABCD2, HEART and other calculatorsREFRESH Note before signing :1}                              Medical Decision Making Amount and/or Complexity of Data Reviewed Labs: ordered. Radiology: ordered. ECG/medicine tests: ordered.  Risk Prescription drug management.   ***  11:38 PM Pharmacy has reviewed chart-- recently prescribed wellbutrin last month.  They have spoken with poison control and recommended 6hr obs for  flexeril (sedation, somnolence, etc), if possible wellbutrin can cause seizures so monitor for that and recommended 24 hour obs. Final Clinical Impression(s) / ED Diagnoses Final diagnoses:  None    Rx / DC Orders ED Discharge Orders     None

## 2023-08-09 DIAGNOSIS — T50902A Poisoning by unspecified drugs, medicaments and biological substances, intentional self-harm, initial encounter: Secondary | ICD-10-CM | POA: Diagnosis not present

## 2023-08-09 DIAGNOSIS — F319 Bipolar disorder, unspecified: Secondary | ICD-10-CM | POA: Diagnosis present

## 2023-08-09 DIAGNOSIS — F17210 Nicotine dependence, cigarettes, uncomplicated: Secondary | ICD-10-CM | POA: Diagnosis present

## 2023-08-09 DIAGNOSIS — T50901A Poisoning by unspecified drugs, medicaments and biological substances, accidental (unintentional), initial encounter: Secondary | ICD-10-CM | POA: Diagnosis present

## 2023-08-09 DIAGNOSIS — K219 Gastro-esophageal reflux disease without esophagitis: Secondary | ICD-10-CM | POA: Diagnosis present

## 2023-08-09 DIAGNOSIS — Z888 Allergy status to other drugs, medicaments and biological substances status: Secondary | ICD-10-CM | POA: Diagnosis not present

## 2023-08-09 DIAGNOSIS — R338 Other retention of urine: Secondary | ICD-10-CM | POA: Diagnosis not present

## 2023-08-09 DIAGNOSIS — F332 Major depressive disorder, recurrent severe without psychotic features: Secondary | ICD-10-CM | POA: Diagnosis not present

## 2023-08-09 DIAGNOSIS — Z5900 Homelessness unspecified: Secondary | ICD-10-CM | POA: Diagnosis not present

## 2023-08-09 DIAGNOSIS — F1910 Other psychoactive substance abuse, uncomplicated: Secondary | ICD-10-CM | POA: Diagnosis not present

## 2023-08-09 DIAGNOSIS — T1491XA Suicide attempt, initial encounter: Secondary | ICD-10-CM | POA: Diagnosis not present

## 2023-08-09 DIAGNOSIS — R339 Retention of urine, unspecified: Secondary | ICD-10-CM | POA: Diagnosis present

## 2023-08-09 DIAGNOSIS — T481X2A Poisoning by skeletal muscle relaxants [neuromuscular blocking agents], intentional self-harm, initial encounter: Secondary | ICD-10-CM | POA: Diagnosis present

## 2023-08-09 DIAGNOSIS — Z91013 Allergy to seafood: Secondary | ICD-10-CM | POA: Diagnosis not present

## 2023-08-09 DIAGNOSIS — R569 Unspecified convulsions: Secondary | ICD-10-CM | POA: Diagnosis present

## 2023-08-09 DIAGNOSIS — F339 Major depressive disorder, recurrent, unspecified: Secondary | ICD-10-CM | POA: Diagnosis not present

## 2023-08-09 DIAGNOSIS — Z91148 Patient's other noncompliance with medication regimen for other reason: Secondary | ICD-10-CM | POA: Diagnosis not present

## 2023-08-09 DIAGNOSIS — Z885 Allergy status to narcotic agent status: Secondary | ICD-10-CM | POA: Diagnosis not present

## 2023-08-09 DIAGNOSIS — Z79899 Other long term (current) drug therapy: Secondary | ICD-10-CM | POA: Diagnosis not present

## 2023-08-09 DIAGNOSIS — F419 Anxiety disorder, unspecified: Secondary | ICD-10-CM | POA: Diagnosis present

## 2023-08-09 DIAGNOSIS — G929 Unspecified toxic encephalopathy: Secondary | ICD-10-CM | POA: Diagnosis present

## 2023-08-09 DIAGNOSIS — E876 Hypokalemia: Secondary | ICD-10-CM | POA: Diagnosis present

## 2023-08-09 DIAGNOSIS — Z560 Unemployment, unspecified: Secondary | ICD-10-CM | POA: Diagnosis not present

## 2023-08-09 DIAGNOSIS — F1510 Other stimulant abuse, uncomplicated: Secondary | ICD-10-CM | POA: Diagnosis present

## 2023-08-09 LAB — URINALYSIS, ROUTINE W REFLEX MICROSCOPIC
Bilirubin Urine: NEGATIVE
Glucose, UA: NEGATIVE mg/dL
Hgb urine dipstick: NEGATIVE
Ketones, ur: 20 mg/dL — AB
Leukocytes,Ua: NEGATIVE
Nitrite: NEGATIVE
Protein, ur: NEGATIVE mg/dL
Specific Gravity, Urine: 1.023 (ref 1.005–1.030)
pH: 6 (ref 5.0–8.0)

## 2023-08-09 LAB — CBC
HCT: 34.4 % — ABNORMAL LOW (ref 36.0–46.0)
Hemoglobin: 11.6 g/dL — ABNORMAL LOW (ref 12.0–15.0)
MCH: 30.5 pg (ref 26.0–34.0)
MCHC: 33.7 g/dL (ref 30.0–36.0)
MCV: 90.5 fL (ref 80.0–100.0)
Platelets: 194 10*3/uL (ref 150–400)
RBC: 3.8 MIL/uL — ABNORMAL LOW (ref 3.87–5.11)
RDW: 12.5 % (ref 11.5–15.5)
WBC: 4.7 10*3/uL (ref 4.0–10.5)
nRBC: 0 % (ref 0.0–0.2)

## 2023-08-09 LAB — BASIC METABOLIC PANEL
Anion gap: 10 (ref 5–15)
BUN: 6 mg/dL (ref 6–20)
CO2: 24 mmol/L (ref 22–32)
Calcium: 8 mg/dL — ABNORMAL LOW (ref 8.9–10.3)
Chloride: 103 mmol/L (ref 98–111)
Creatinine, Ser: 0.64 mg/dL (ref 0.44–1.00)
GFR, Estimated: >60 mL/min (ref >60)
Glucose, Bld: 87 mg/dL (ref 70–99)
Potassium: 3.4 mmol/L — ABNORMAL LOW (ref 3.5–5.1)
Sodium: 137 mmol/L (ref 135–145)

## 2023-08-09 LAB — PREGNANCY, URINE: Preg Test, Ur: NEGATIVE

## 2023-08-09 LAB — RAPID URINE DRUG SCREEN, HOSP PERFORMED
Amphetamines: POSITIVE — AB
Barbiturates: NOT DETECTED
Benzodiazepines: NOT DETECTED
Cocaine: NOT DETECTED
Opiates: NOT DETECTED
Tetrahydrocannabinol: NOT DETECTED

## 2023-08-09 LAB — HIV ANTIBODY (ROUTINE TESTING W REFLEX): HIV Screen 4th Generation wRfx: NONREACTIVE

## 2023-08-09 LAB — CREATININE, SERUM
Creatinine, Ser: 0.62 mg/dL (ref 0.44–1.00)
GFR, Estimated: >60 mL/min (ref >60)

## 2023-08-09 LAB — ACETAMINOPHEN LEVEL: Acetaminophen (Tylenol), Serum: <10 ug/mL — ABNORMAL LOW (ref 10–30)

## 2023-08-09 MED ORDER — PROCHLORPERAZINE EDISYLATE 10 MG/2ML IJ SOLN: 5.0000 mg | Freq: Four times a day (QID) | INTRAMUSCULAR | Status: DC | PRN

## 2023-08-09 MED ORDER — LORAZEPAM 2 MG/ML IJ SOLN: 1.0000 mg | Freq: Four times a day (QID) | INTRAMUSCULAR | Status: DC | PRN

## 2023-08-09 MED ORDER — POTASSIUM CHLORIDE 2 MEQ/ML IV SOLN
INTRAVENOUS | Status: AC
  Filled 2023-08-09 (×4): qty 1000

## 2023-08-09 MED ORDER — ENOXAPARIN SODIUM 40 MG/0.4ML IJ SOSY
40.0000 mg | PREFILLED_SYRINGE | INTRAMUSCULAR | Status: DC
  Administered 2023-08-09 – 2023-08-14 (×6): 40 mg via SUBCUTANEOUS
  Filled 2023-08-09 (×6): qty 0.4

## 2023-08-09 NOTE — Progress Notes (Addendum)
4401: Patient obtunded. Slight flinch noted on bilateral lower extremities when pressing down on toe nail. Patient does not respond to voice or sternal rub. Snoring loudly. Patient's arms flailing during bedside report- per night shift nurse MD aware stated it was reflexive flailing from withdrawing from Flexeril. 3mm bilateral pupils PERRLA intact.   Oxygen and suction set up at the bedside. No orders for seizure precautions.  0272: Seizure precautions placed- seizure pads placed at this time.

## 2023-08-09 NOTE — Progress Notes (Signed)
PROGRESS NOTE    Christina Stokes  ZOX:[id number]  DOB: 11/03/1976  DOA: 08/08/2023 PCP: Vivien Presto, MD Outpatient Specialists:   Hospital course:  47 year old female with known polysubstance use, ongoing methamphetamine use, homelessness was admitted earlier this morning with apparent suicide attempt.  She was noted to have a suicide note on her an empty bottle of Flexeril.  On admission she was intended, poison control was called and recommendation was made for 72-hour observation for seizure activity and urinary retention.  Brain imaging was unrevealing.   Subjective:  Patient remains obtunded.  She does push away my hand to sternal rub.   Objective: Vitals:   08/09/23 0445 08/09/23 0753 08/09/23 0948 08/09/23 1137  BP: 133/82 137/87 (!) 139/91 126/89  Pulse: 94 97 99 98  Resp: 16 18    Temp: (!) 97.5 F (36.4 C)  97.7 F (36.5 C) 98.3 F (36.8 C)  TempSrc: Axillary  Axillary Axillary  SpO2: 97% 100% 100% 94%  Weight: 74.2 kg     Height: 5\' 3"  (1.6 m)       Intake/Output Summary (Last 24 hours) at 08/09/2023 1321 Last data filed at 08/09/2023 1021 Gross per 24 hour  Intake 300 ml  Output 0 ml  Net 300 ml   Filed Weights   08/08/23 2256 08/09/23 0445  Weight: 81.2 kg 74.2 kg     Exam:  General: Patient lying in bed at 40 degrees with audible snoring, tries to remove my hand to sternal rub CVS: S1-S2, regular  Respiratory:  decreased air entry bilaterally secondary to decreased inspiratory effort, rales at bases  GI: NABS, soft, NT  LE: Warm and well-perfused Neuro: GCS 8, no eye-opening, incomprehensible sounds and tries to remove my hands to sternal rub .  Data Reviewed:  Basic Metabolic Panel: Recent Labs  Lab 08/08/23 2307 08/08/23 2328 08/09/23 0713  NA 137 140  --   K 2.5* 2.5*  --   CL 101 100  --   CO2 23  --   --   GLUCOSE 137* 136*  --   BUN 14 14  --   CREATININE 0.72 0.60 0.62  CALCIUM 8.4*  --   --   MG 2.0  --   --      CBC: Recent Labs  Lab 08/08/23 2307 08/08/23 2328 08/09/23 0713  WBC 5.2  --  4.7  HGB 12.4 12.9 11.6*  HCT 38.1 38.0 34.4*  MCV 91.6  --  90.5  PLT 216  --  194     Scheduled Meds:  enoxaparin (LOVENOX) injection  40 mg Subcutaneous Q24H   Continuous Infusions:  lactated ringers 1,000 mL with potassium chloride 40 mEq infusion 100 mL/hr at 08/09/23 0701     Assessment & Plan:   Suicide attempt--IVC done in ED Toxic encephalopathy Patient found with empty bottle of Flexeril at the scene UDS positive for methamphetamines, salicylates Patient being observed under suicide precautions with sitter for seizure/urinary retention/QTc Psychiatry consult placed--but I noted patient was still obtunded on consult  Hypokalemia Potassium been repleted, will recheck now  Urinary retention To have 550 cc in urine, will place Foley     DVT prophylaxis: Lovenox Code Status: Full Family Communication: Long conversation with patient's daughter Marcelline Deist.     Studies: CT Cervical Spine Wo Contrast  Result Date: 08/09/2023 CLINICAL DATA:  Facial trauma, blunt, possible seizure, overdose on Flexeril, altered mental status, found on sidewalk. EXAM: CT CERVICAL SPINE WITHOUT CONTRAST TECHNIQUE: Multidetector CT  imaging of the cervical spine was performed without intravenous contrast. Multiplanar CT image reconstructions were also generated. RADIATION DOSE REDUCTION: This exam was performed according to the departmental dose-optimization program which includes automated exposure control, adjustment of the mA and/or kV according to patient size and/or use of iterative reconstruction technique. COMPARISON:  None Available. FINDINGS: Alignment: Rightward rotation of the atlas in relation to the dens is presumed positional though clinical correlation is recommended to exclude rotary subluxation. Loss of lordosis is likely positional. Skull base and vertebrae: No acute fracture. Soft tissues  and spinal canal: No prevertebral fluid or swelling. No visible canal hematoma. Disc levels: Mild multilevel spondylosis. No significant spinal canal narrowing. Upper chest: No acute abnormality. Other: Nasopharyngeal tube. IMPRESSION: 1. Rightward rotation of the atlas in relation to the dens is presumed positional though clinical correlation is recommended to exclude rotary subluxation. 2.  No acute fracture. Electronically Signed   By: Minerva Fester M.D.   On: 08/09/2023 01:07   CT HEAD WO CONTRAST ( )  Result Date: 08/09/2023 CLINICAL DATA:  Seizure, overdose EXAM: CT HEAD WITHOUT CONTRAST TECHNIQUE: Multidetector CT imaging of the head was performed following the standard protocol without intravenous contrast. RADIATION DOSE REDUCTION: This exam was performed according to the departmental dose-optimization program which includes automated exposure control, adjustment of the mA and/or kV according to patient size and/or use of iterative reconstruction technique. COMPARISON:  None Available. FINDINGS: CT HEAD FINDINGS Brain: The examination is markedly limited by motion artifact. No large acute intracranial hemorrhage identified. No grossly abnormal mass effect or midline shift. Ventricular size is normal. Vascular: Limited by motion artifact Skull: No definite fracture identified.  Marked motion artifact. Sinuses/Orbits: Visualized paranasal sinuses are clear. Orbits are poorly evaluated due to motion artifact. Other: Mastoid air cells and middle ear cavities are clear. IMPRESSION: 1. Markedly limited examination due to motion artifact. No large acute intracranial hemorrhage identified. No grossly abnormal mass effect or midline shift. Electronically Signed   By: Helyn Numbers M.D.   On: 08/09/2023 01:04   DG Chest Port 1 View  Result Date: 08/08/2023 CLINICAL DATA:  Altered mental status. EXAM: PORTABLE CHEST 1 VIEW COMPARISON:  07/17/2021 FINDINGS: Lung volumes are low. Normal heart size for  technique. Minor retrocardiac atelectasis. No focal airspace disease, large pleural effusion or pneumothorax. No acute osseous abnormalities are seen. IMPRESSION: Low lung volumes with minor retrocardiac atelectasis. Electronically Signed   By: Narda Rutherford M.D.   On: 08/08/2023 23:49    Principal Problem:   Overdose     Horatio Pel Orma Flaming, [facility]  If 7PM-7AM, please contact night-coverage [email or web address]   LOS: 0 days

## 2023-08-09 NOTE — ED Notes (Signed)
Poison control notified by pharmacy. Recommendations:  72hr observation due to Wellbutrin, monitor for seizure activity, twitching to be expected with Flexeril overdose, monitor for urinary retention, cardiac monitoring x 24 hours, recommend treatment with fluids and benzos if needed for agitation and seizures.

## 2023-08-09 NOTE — Progress Notes (Signed)
   08/09/23 1650  Vitals  Temp 99.4 F (37.4 C)  Temp Source Axillary  BP (!) 125/92  MAP (mmHg) 99  BP Location Left Arm  BP Method Automatic  Patient Position (if appropriate) Lying  Pulse Rate (!) 101  Pulse Rate Source Monitor  ECG Heart Rate (!) 101  Resp 19  Level of Consciousness  Level of Consciousness Responds to Pain  MEWS COLOR  MEWS Score Color Yellow  Oxygen Therapy  SpO2 100 %  O2 Device Room Air  Glasgow Coma Scale  Eye Opening 1  Best Verbal Response (NON-intubated) 1  Best Motor Response 4  Glasgow Coma Scale Score 6  MEWS Score  MEWS Temp 0  MEWS Systolic 0  MEWS Pulse 1  MEWS RR 0  MEWS LOC 2  MEWS Score 3   Oral care provided. Patient pushing RN hand away, eyes flinching. Patient moving her mouth as if she was trying to speak. Movement subside when oral care was done. Sitter remains at bedside.

## 2023-08-09 NOTE — H&P (Addendum)
History and Physical  Christina Stokes XLK:[id number] DOB: 1976-05-26 DOA: 08/08/2023  Referring physician: Arbie Cookey  PCP: Vivien Presto, MD  Outpatient Specialists: Urology Patient coming from: Home.  Chief Complaint: Intentional overdose, suicidal attempt  HPI: Christina Stokes is a 47 y.o. female with medical history significant for iron deficiency anemia, chronic anxiety/depression, tobacco use disorder, who presented to the ED via EMS due to suicidal attempt with overdose of prescribed medications.  The patient was found by a bystander having what appeared to be a seizure like activity.  EMS was activated and upon arrival, the patient had a suicidal note on her and an empty bottle of Flexeril.  Was administered Narcan en route with some response.  In the ED, somnolent and protecting her airway.  Response to painful stimuli.  Poison control was notified by pharmacy.  Recommend 72-hour observation due to Wellbutrin and to monitor for seizure activity and urinary retention.  Admitted by [facility], hospitalist service.  ED Course: Temperature 97.5.  BP 133/82, pulse 94, respiratory 16, saturation 97% on room air.  Lab studies notable for serum potassium 2.5.  Glucose 126.  Review of Systems: Review of systems as noted in the HPI. All other systems reviewed and are negative.   Past Medical History:  Diagnosis Date   Anxiety    Back pain    GERD (gastroesophageal reflux disease)    Opiate addiction (HCC)    Opioid dependence on agonist therapy [facility])    Past Surgical History:  Procedure Laterality Date   LEG SURGERY     TUBAL LIGATION      Social History:  reports that she quit smoking about 2 years ago. Her smoking use included cigarettes. She has never used smokeless tobacco. She reports current alcohol use. She reports that she does not use drugs.   Allergies  Allergen Reactions   Shellfish-Derived Products Nausea And Vomiting   Codeine Itching   Shellfish Allergy Nausea And  Vomiting    Family history: None reported.  Prior to Admission medications   Medication Sig Start Date End Date Taking? Authorizing Provider  Aspirin-Acetaminophen-Caffeine (GOODY HEADACHE PO) Take by mouth.    [provider]  Citalopram Hydrobromide (CELEXA PO) Take by mouth.    [provider]  clonazePAM (KLONOPIN) 1 MG disintegrating tablet Take 1 tablet (1 mg total) by mouth at bedtime as needed (For sleep or restless legs). 12/18/21   Molpus, Jonny Ruiz, MD  clonazePAM (KLONOPIN) 1 MG disintegrating tablet Take 1 tablet (1 mg total) by mouth at bedtime as needed for sleep or restless leg. 12/18/21   Molpus, Jonny Ruiz, MD  LISINOPRIL PO Take by mouth.    [provider]    Physical Exam: BP 133/82 (BP Location: Left Arm)   Pulse 94   Temp (!) 97.5 F (36.4 C) (Axillary)   Resp 16   Ht 5\' 3"  (1.6 m)   Wt 74.2 kg   SpO2 97%   BMI 28.98 kg/m   General: 47 y.o. year-old female well developed well nourished in no acute distress.  Somnolent. Cardiovascular: Regular rate and rhythm with no rubs or gallops.  No thyromegaly or JVD noted.  No lower extremity edema. 2/4 pulses in all 4 extremities. Respiratory: Clear to auscultation with no wheezes or rales. Good inspiratory effort. Abdomen: Soft nontender nondistended with normal bowel sounds x4 quadrants. Muskuloskeletal: No cyanosis, clubbing or edema noted bilaterally Neuro: CN II-XII intact, strength, sensation, reflexes Skin: No ulcerative lesions noted or rashes Psychiatry: Unable to assess  judgment and mood due to somnolence.         Labs on Admission:  Basic Metabolic Panel: Recent Labs  Lab 08/08/23 2307 08/08/23 2328  NA 137 140  K 2.5* 2.5*  CL 101 100  CO2 23  --   GLUCOSE 137* 136*  BUN 14 14  CREATININE 0.72 0.60  CALCIUM 8.4*  --   MG 2.0  --    Liver Function Tests: Recent Labs  Lab 08/08/23 2307  AST 26  ALT 29  ALKPHOS 67  BILITOT 0.8  PROT 6.7  ALBUMIN 3.8   No results for  input(s): "LIPASE", "AMYLASE" in the last 168 hours. No results for input(s): "AMMONIA" in the last 168 hours. CBC: Recent Labs  Lab 08/08/23 2307 08/08/23 2328  WBC 5.2  --   HGB 12.4 12.9  HCT 38.1 38.0  MCV 91.6  --   PLT 216  --    Cardiac Enzymes: No results for input(s): "CKTOTAL", "CKMB", "CKMBINDEX", "TROPONINI" in the last 168 hours.  BNP (last 3 results) No results for input(s): "BNP" in the last 8760 hours.  ProBNP (last 3 results) No results for input(s): "PROBNP" in the last 8760 hours.  CBG: Recent Labs  Lab 08/08/23 2316  GLUCAP 134*    Radiological Exams on Admission: CT Cervical Spine Wo Contrast  Result Date: 08/09/2023 CLINICAL DATA:  Facial trauma, blunt, possible seizure, overdose on Flexeril, altered mental status, found on sidewalk. EXAM: CT CERVICAL SPINE WITHOUT CONTRAST TECHNIQUE: Multidetector CT imaging of the cervical spine was performed without intravenous contrast. Multiplanar CT image reconstructions were also generated. RADIATION DOSE REDUCTION: This exam was performed according to the departmental dose-optimization program which includes automated exposure control, adjustment of the mA and/or kV according to patient size and/or use of iterative reconstruction technique. COMPARISON:  None Available. FINDINGS: Alignment: Rightward rotation of the atlas in relation to the dens is presumed positional though clinical correlation is recommended to exclude rotary subluxation. Loss of lordosis is likely positional. Skull base and vertebrae: No acute fracture. Soft tissues and spinal canal: No prevertebral fluid or swelling. No visible canal hematoma. Disc levels: Mild multilevel spondylosis. No significant spinal canal narrowing. Upper chest: No acute abnormality. Other: Nasopharyngeal tube. IMPRESSION: 1. Rightward rotation of the atlas in relation to the dens is presumed positional though clinical correlation is recommended to exclude rotary subluxation. 2.   No acute fracture. Electronically Signed   By: Minerva Fester M.D.   On: 08/09/2023 01:07   CT HEAD WO CONTRAST ( )  Result Date: 08/09/2023 CLINICAL DATA:  Seizure, overdose EXAM: CT HEAD WITHOUT CONTRAST TECHNIQUE: Multidetector CT imaging of the head was performed following the standard protocol without intravenous contrast. RADIATION DOSE REDUCTION: This exam was performed according to the departmental dose-optimization program which includes automated exposure control, adjustment of the mA and/or kV according to patient size and/or use of iterative reconstruction technique. COMPARISON:  None Available. FINDINGS: CT HEAD FINDINGS Brain: The examination is markedly limited by motion artifact. No large acute intracranial hemorrhage identified. No grossly abnormal mass effect or midline shift. Ventricular size is normal. Vascular: Limited by motion artifact Skull: No definite fracture identified.  Marked motion artifact. Sinuses/Orbits: Visualized paranasal sinuses are clear. Orbits are poorly evaluated due to motion artifact. Other: Mastoid air cells and middle ear cavities are clear. IMPRESSION: 1. Markedly limited examination due to motion artifact. No large acute intracranial hemorrhage identified. No grossly abnormal mass effect or midline shift. Electronically Signed   By: Gloris Ham  Ramiro Harvest M.D.   On: 08/09/2023 01:04   DG Chest Port 1 View  Result Date: 08/08/2023 CLINICAL DATA:  Altered mental status. EXAM: PORTABLE CHEST 1 VIEW COMPARISON:  07/17/2021 FINDINGS: Lung volumes are low. Normal heart size for technique. Minor retrocardiac atelectasis. No focal airspace disease, large pleural effusion or pneumothorax. No acute osseous abnormalities are seen. IMPRESSION: Low lung volumes with minor retrocardiac atelectasis. Electronically Signed   By: Narda Rutherford M.D.   On: 08/08/2023 23:49    EKG: I independently viewed the EKG done and my findings are as followed: Sinus tachycardia 112.   Nonspecific ST-T changes.  QTc 485.  Assessment/Plan Present on Admission:  Overdose  Principal Problem:   Overdose  Suicidal attempt with overdose of prescribed medications Continue to monitor on telemetry Seizure precautions. Poison control was notified by pharmacy.  Recommend 72-hour observation due to Wellbutrin and to monitor for seizure activity and urinary retention. IVC' D in the ED Consult psychiatry once the patient is stable One-to-one sitter for patient's own safety  Hypokalemia Serum potassium 2.5 Repleted intravenously Repeat chemistry panel and obtain magnesium level in the morning  Chronic anxiety/depression Consult psychiatry once the patient is stable   Time: 75 minutes.   DVT prophylaxis: Subcu Lovenox daily  Code Status: Full code  Family Communication: None at bedside  Disposition Plan: Admitted to progressive care unit  Consults called: None.  Admission status: Inpatient status.   Status is: Inpatient The patient requires at least 2 midnights for further evaluation and treatment of present condition.   Darlin Drop MD [facility] Pager [phone]  If 7PM-7AM, please contact night-coverage [email or web address] Password [email or web address]  08/09/2023, 5:55 AM

## 2023-08-09 NOTE — ED Notes (Signed)
IVC paperwork Completed. Tubing up to floor from ER.

## 2023-08-09 NOTE — Progress Notes (Signed)
   08/09/23 1421  Vitals  Temp 99.3 F (37.4 C)  Temp Source Axillary  BP 132/85  MAP (mmHg) 99  BP Location Left Arm  BP Method Automatic  Patient Position (if appropriate) Lying  Pulse Rate 100  Pulse Rate Source Monitor  ECG Heart Rate 100  MEWS COLOR  MEWS Score Color Yellow  Oxygen Therapy  SpO2 100 %  O2 Device Room Air  MEWS Score  MEWS Temp 0  MEWS Systolic 0  MEWS Pulse 0  MEWS RR 0  MEWS LOC 3  MEWS Score 3   Patient appeared diaphoretic. VS obtained. Blanket removed. Skin warmed to touch. Sitter and daughter remain at bedside at this time.

## 2023-08-09 NOTE — Progress Notes (Signed)
Bladder scan performed. Patient retaining 565cc. MD informed.

## 2023-08-09 NOTE — Progress Notes (Signed)
Primary nurse attempted to perform oral care. Patient started pushing nurse hand away. Eyes flickering. Attempted to perform oral care x3 and got the same response.  Daughter at bedside to witness.

## 2023-08-09 NOTE — ED Notes (Signed)
[place] J Coller repositioned

## 2023-08-09 NOTE — Progress Notes (Signed)
Received call from poison control; update provided.

## 2023-08-09 NOTE — Progress Notes (Addendum)
   Received incoming call from poison control Jearld Adjutant at [phone]). Was informed to monitor for urinary retention, seizure activity, elevated HR and BP. Recommended a repeat 12 Lead EKG to monitor Qtc. Message sent to MD.

## 2023-08-09 NOTE — ED Notes (Signed)
ED TO INPATIENT HANDOFF REPORT  ED Nurse Name and Phone #: Dante Gang, Paramedic  S Name/Age/Gender Christina Stokes 47 y.o. female Room/Bed: TRAAC/TRAAC  Code Status   Code Status: Not on file  Home/SNF/Other Home  Is this baseline? No   Triage Complete: Triage complete  Chief Complaint Overdose [T50.901A]  Triage Note Pt BIB EMS, pt found by bystander having what looked to be seizure like activity. Upon EMS arrival pt having slight twitching and found with what appears to be a suicide note with an empty bottle of Flexeril. Alert to pain upon arrival to ED.    Allergies Allergies  Allergen Reactions   Shellfish-Derived Products Nausea And Vomiting   Codeine Itching   Shellfish Allergy Nausea And Vomiting    Level of Care/Admitting Diagnosis ED Disposition     ED Disposition  Admit   Condition  --   Comment  Hospital Area: [facility] [[id number]]  Level of Care: Progressive [102]  Admit to Progressive based on following criteria: MULTISYSTEM THREATS such as stable sepsis, metabolic/electrolyte imbalance with or without encephalopathy that is responding to early treatment.  May admit patient to [facility] or [facility] if equivalent level of care is available:: Yes  Covid Evaluation: Asymptomatic - no recent exposure (last 10 days) testing not required  Diagnosis: Overdose [202577]  Admitting Physician: Darlin Drop [[id number]]  Attending Physician: Darlin Drop [[id number]]  Certification:: I certify this patient will need inpatient services for at least 2 midnights  Expected Medical Readiness: 08/11/2023          B Medical/Surgery History Past Medical History:  Diagnosis Date   Anxiety    Back pain    GERD (gastroesophageal reflux disease)    Opiate addiction (HCC)    Opioid dependence on agonist therapy [facility])    Past Surgical History:  Procedure Laterality Date   LEG SURGERY     TUBAL LIGATION       A IV  Location/Drains/Wounds Patient Lines/Drains/Airways Status     Active Line/Drains/Airways     Name Placement date Placement time Site Days   Peripheral IV 08/08/23 18 G Posterior;Right Wrist 08/08/23  2251  Wrist  1   Peripheral IV 08/08/23 20 G Left;Posterior Hand 08/08/23  2252  Hand  1            Intake/Output Last 24 hours No intake or output data in the 24 hours ending 08/09/23 0303  Labs/Imaging Results for orders placed or performed during the hospital encounter of 08/08/23 (from the past 48 hour(s))  CBC     Status: None   Collection Time: 08/08/23 11:07 PM  Result Value Ref Range   WBC 5.2 4.0 - 10.5 K/uL   RBC 4.16 3.87 - 5.11 MIL/uL   Hemoglobin 12.4 12.0 - 15.0 g/dL   HCT 95.2 84.1 - 32.4 %   MCV 91.6 80.0 - 100.0 fL   MCH 29.8 26.0 - 34.0 pg   MCHC 32.5 30.0 - 36.0 g/dL   RDW 40.1 02.7 - 25.3 %   Platelets 216 150 - 400 K/uL   nRBC 0.0 0.0 - 0.2 %    Comment: Performed at [facility], [address] [place], [place] [address]  Comprehensive metabolic panel     Status: Abnormal   Collection Time: 08/08/23 11:07 PM  Result Value Ref Range   Sodium 137 135 - 145 mmol/L   Potassium 2.5 (LL) 3.5 - 5.1 mmol/L    Comment: CRITICAL  RESULT CALLED TO, READ BACK BY AND VERIFIED WITH JAMES,A RN 2341 08/08/23 AMIREHSANI F   Chloride 101 98 - 111 mmol/L   CO2 23 22 - 32 mmol/L   Glucose, Bld 137 (H) 70 - 99 mg/dL    Comment: Glucose reference range applies only to samples taken after fasting for at least 8 hours.   BUN 14 6 - 20 mg/dL   Creatinine, Ser 1.61 0.44 - 1.00 mg/dL   Calcium 8.4 (L) 8.9 - 10.3 mg/dL   Total Protein 6.7 6.5 - 8.1 g/dL   Albumin 3.8 3.5 - 5.0 g/dL   AST 26 15 - 41 U/L   ALT 29 0 - 44 U/L   Alkaline Phosphatase 67 38 - 126 U/L   Total Bilirubin 0.8 0.3 - 1.2 mg/dL   GFR, Estimated >09 >60 mL/min    Comment: (NOTE) Calculated using the CKD-EPI Creatinine Equation (2021)    Anion gap 13 5 - 15    Comment: Performed at [facility], [address], [place], [place] [address]  Acetaminophen level     Status: Abnormal   Collection Time: 08/08/23 11:07 PM  Result Value Ref Range   Acetaminophen (Tylenol), Serum <10 (L) 10 - 30 ug/mL    Comment: (NOTE) Therapeutic concentrations vary significantly. A range of 10-30 ug/mL  may be an effective concentration for many patients. However, some  are best treated at concentrations outside of this range. Acetaminophen concentrations >150 ug/mL at 4 hours after ingestion  and >50 ug/mL at 12 hours after ingestion are often associated with  toxic reactions.  Performed at [facility], [address] [place], [place] [address]   Salicylate level     Status: Abnormal   Collection Time: 08/08/23 11:07 PM  Result Value Ref Range   Salicylate Lvl <7.0 (L) 7.0 - 30.0 mg/dL    Comment: Performed at [facility], [address] [place], [place] [address]  Ethanol     Status: None   Collection Time: 08/08/23 11:07 PM  Result Value Ref Range   Alcohol, Ethyl (B) <10 <10 mg/dL    Comment: (NOTE) Lowest detectable limit for serum alcohol is 10 mg/dL.  For medical purposes only. Performed at [facility], [address], [place], [place] [address]   Magnesium     Status: None   Collection Time: 08/08/23 11:07 PM  Result Value Ref Range   Magnesium 2.0 1.7 - 2.4 mg/dL    Comment: Performed at [facility], [address], [place], [place] [address]  POC CBG, ED     Status: Abnormal   Collection Time: 08/08/23 11:16 PM  Result Value Ref Range   Glucose-Capillary 134 (H) 70 - 99 mg/dL    Comment: Glucose reference range applies only to samples taken after fasting for at least 8 hours.  I-stat chem 8, ED     Status: Abnormal   Collection Time: 08/08/23 11:28 PM  Result Value Ref Range   Sodium 140 135 - 145 mmol/L   Potassium 2.5 (LL) 3.5 - 5.1 mmol/L   Chloride 100 98 - 111 mmol/L   BUN 14 6 - 20 mg/dL   Creatinine, Ser 8.46 0.44 - 1.00 mg/dL    Glucose, Bld 962 (H) 70 - 99 mg/dL    Comment: Glucose reference range applies only to samples taken after fasting for at least 8 hours.   Calcium, Ion 1.12 (L) 1.15 - 1.40 mmol/L  TCO2 27 22 - 32 mmol/L   Hemoglobin 12.9 12.0 - 15.0 g/dL   HCT 16.1 09.6 - 04.5 %   Comment NOTIFIED PHYSICIAN   Urinalysis, Routine w reflex microscopic -Urine, Catheterized     Status: Abnormal   Collection Time: 08/09/23  1:09 AM  Result Value Ref Range   Color, Urine YELLOW YELLOW   APPearance CLEAR CLEAR   Specific Gravity, Urine 1.023 1.005 - 1.030   pH 6.0 5.0 - 8.0   Glucose, UA NEGATIVE NEGATIVE mg/dL   Hgb urine dipstick NEGATIVE NEGATIVE   Bilirubin Urine NEGATIVE NEGATIVE   Ketones, ur 20 (A) NEGATIVE mg/dL   Protein, ur NEGATIVE NEGATIVE mg/dL   Nitrite NEGATIVE NEGATIVE   Leukocytes,Ua NEGATIVE NEGATIVE    Comment: Performed at [facility], [address], [place], [place] [address]  Pregnancy, urine     Status: None   Collection Time: 08/09/23  1:09 AM  Result Value Ref Range   Preg Test, Ur NEGATIVE NEGATIVE    Comment:        THE SENSITIVITY OF THIS METHODOLOGY IS >25 mIU/mL. Performed at [facility], [address] [place], [place] [address]   Rapid urine drug screen (hospital performed)     Status: Abnormal   Collection Time: 08/09/23  1:09 AM  Result Value Ref Range   Opiates NONE DETECTED NONE DETECTED   Cocaine NONE DETECTED NONE DETECTED   Benzodiazepines NONE DETECTED NONE DETECTED   Amphetamines POSITIVE (A) NONE DETECTED   Tetrahydrocannabinol NONE DETECTED NONE DETECTED   Barbiturates NONE DETECTED NONE DETECTED    Comment: (NOTE) DRUG SCREEN FOR MEDICAL PURPOSES ONLY.  IF CONFIRMATION IS NEEDED FOR ANY PURPOSE, NOTIFY LAB WITHIN 5 DAYS.  LOWEST DETECTABLE LIMITS FOR URINE DRUG SCREEN Drug Class                     Cutoff (ng/mL) Amphetamine and metabolites    1000 Barbiturate and metabolites    200 Benzodiazepine                 200 Opiates  and metabolites        300 Cocaine and metabolites        300 THC                            50 Performed at [facility], [address] [place], [place] [address]    CT Cervical Spine Wo Contrast  Result Date: 08/09/2023 CLINICAL DATA:  Facial trauma, blunt, possible seizure, overdose on Flexeril, altered mental status, found on sidewalk. EXAM: CT CERVICAL SPINE WITHOUT CONTRAST TECHNIQUE: Multidetector CT imaging of the cervical spine was performed without intravenous contrast. Multiplanar CT image reconstructions were also generated. RADIATION DOSE REDUCTION: This exam was performed according to the departmental dose-optimization program which includes automated exposure control, adjustment of the mA and/or kV according to patient size and/or use of iterative reconstruction technique. COMPARISON:  None Available. FINDINGS: Alignment: Rightward rotation of the atlas in relation to the dens is presumed positional though clinical correlation is recommended to exclude rotary subluxation. Loss of lordosis is likely positional. Skull base and vertebrae: No acute fracture. Soft tissues and spinal canal: No prevertebral fluid or swelling. No visible canal hematoma. Disc levels: Mild multilevel spondylosis. No significant spinal canal narrowing. Upper chest: No acute abnormality. Other: Nasopharyngeal tube. IMPRESSION: 1. Rightward rotation of the atlas in relation to the  dens is presumed positional though clinical correlation is recommended to exclude rotary subluxation. 2.  No acute fracture. Electronically Signed   By: Minerva Fester M.D.   On: 08/09/2023 01:07   CT HEAD WO CONTRAST ( )  Result Date: 08/09/2023 CLINICAL DATA:  Seizure, overdose EXAM: CT HEAD WITHOUT CONTRAST TECHNIQUE: Multidetector CT imaging of the head was performed following the standard protocol without intravenous contrast. RADIATION DOSE REDUCTION: This exam was performed according to the departmental dose-optimization  program which includes automated exposure control, adjustment of the mA and/or kV according to patient size and/or use of iterative reconstruction technique. COMPARISON:  None Available. FINDINGS: CT HEAD FINDINGS Brain: The examination is markedly limited by motion artifact. No large acute intracranial hemorrhage identified. No grossly abnormal mass effect or midline shift. Ventricular size is normal. Vascular: Limited by motion artifact Skull: No definite fracture identified.  Marked motion artifact. Sinuses/Orbits: Visualized paranasal sinuses are clear. Orbits are poorly evaluated due to motion artifact. Other: Mastoid air cells and middle ear cavities are clear. IMPRESSION: 1. Markedly limited examination due to motion artifact. No large acute intracranial hemorrhage identified. No grossly abnormal mass effect or midline shift. Electronically Signed   By: Helyn Numbers M.D.   On: 08/09/2023 01:04   DG Chest Port 1 View  Result Date: 08/08/2023 CLINICAL DATA:  Altered mental status. EXAM: PORTABLE CHEST 1 VIEW COMPARISON:  07/17/2021 FINDINGS: Lung volumes are low. Normal heart size for technique. Minor retrocardiac atelectasis. No focal airspace disease, large pleural effusion or pneumothorax. No acute osseous abnormalities are seen. IMPRESSION: Low lung volumes with minor retrocardiac atelectasis. Electronically Signed   By: Narda Rutherford M.D.   On: 08/08/2023 23:49    Pending Labs Unresulted Labs (From admission, onward)     Start     Ordered   08/09/23 0300  Acetaminophen level  Once,   STAT        08/09/23 0144            Vitals/Pain Today's Vitals   08/08/23 2256 08/08/23 2306 08/09/23 0045 08/09/23 0200  BP: (!) 145/114 (!) 157/107 (!) 149/96 (!) 154/97  Pulse: (!) 115 (!) 120 (!) 101 (!) 104  Resp: (!) 22 17 14 15   Temp: (!) 97.3 F (36.3 C)     TempSrc: Temporal     SpO2: 98% 95% 98% 99%  Weight: 179 lb 0.2 oz (81.2 kg)     Height: 5\' 3"  (1.6 m)       Isolation  Precautions No active isolations  Medications Medications  potassium chloride 10 mEq in 100 mL IVPB (10 mEq Intravenous New Bag/Given 08/09/23 0237)  naloxone [facility]) injection 0.4 mg (0.4 mg Intravenous Given 08/08/23 2255)  naloxone [facility]) injection 1 mg (1 mg Intravenous Given 08/08/23 2311)  sodium chloride 0.9 % bolus 1,000 mL (1,000 mLs Intravenous New Bag/Given 08/09/23 0016)    Mobility walks     Focused Assessments     R Recommendations: See Admitting Provider Note  Report given to:   Additional Notes:

## 2023-08-09 NOTE — Progress Notes (Signed)
TRH night cross cover note:   I was contacted by RN requesting order for Foley catheter to reconcile the placement of Foley catheter that had occurred earlier this afternoon in the setting of urinary retention.  I subsequently placed an order for placement of Foley catheter to reconcile the above.     Newton Pigg, DO Hospitalist

## 2023-08-09 NOTE — Plan of Care (Signed)
  Problem: Clinical Measurements: Goal: Ability to maintain clinical measurements within normal limits will improve Outcome: Progressing Goal: Will remain free from infection Outcome: Progressing Goal: Diagnostic test results will improve Outcome: Progressing Goal: Respiratory complications will improve Outcome: Progressing Goal: Cardiovascular complication will be avoided Outcome: Progressing   Problem: Pain Managment: Goal: General experience of comfort will improve Outcome: Progressing   Problem: Skin Integrity: Goal: Risk for impaired skin integrity will decrease Outcome: Progressing

## 2023-08-09 NOTE — Progress Notes (Addendum)
1019: Slight movement noted in Upper extremities when lovenox injection given.  1026:MD at bedside to assess patient. Patient pushed provider's hand away sternal rub. Flailing extremities.

## 2023-08-10 ENCOUNTER — Inpatient Hospital Stay (HOSPITAL_COMMUNITY): Payer: Medicaid Other

## 2023-08-10 DIAGNOSIS — G929 Unspecified toxic encephalopathy: Secondary | ICD-10-CM

## 2023-08-10 DIAGNOSIS — T50902A Poisoning by unspecified drugs, medicaments and biological substances, intentional self-harm, initial encounter: Secondary | ICD-10-CM | POA: Diagnosis not present

## 2023-08-10 LAB — BASIC METABOLIC PANEL
Anion gap: 11 (ref 5–15)
BUN: <5 mg/dL — ABNORMAL LOW (ref 6–20)
CO2: 23 mmol/L (ref 22–32)
Calcium: 8.4 mg/dL — ABNORMAL LOW (ref 8.9–10.3)
Chloride: 101 mmol/L (ref 98–111)
Creatinine, Ser: 0.58 mg/dL (ref 0.44–1.00)
GFR, Estimated: >60 mL/min (ref >60)
Glucose, Bld: 82 mg/dL (ref 70–99)
Potassium: 3.7 mmol/L (ref 3.5–5.1)
Sodium: 135 mmol/L (ref 135–145)

## 2023-08-10 LAB — CBC
HCT: 36.6 % (ref 36.0–46.0)
Hemoglobin: 12 g/dL (ref 12.0–15.0)
MCH: 29.1 pg (ref 26.0–34.0)
MCHC: 32.8 g/dL (ref 30.0–36.0)
MCV: 88.8 fL (ref 80.0–100.0)
Platelets: 191 10*3/uL (ref 150–400)
RBC: 4.12 MIL/uL (ref 3.87–5.11)
RDW: 12.5 % (ref 11.5–15.5)
WBC: 7.3 10*3/uL (ref 4.0–10.5)
nRBC: 0 % (ref 0.0–0.2)

## 2023-08-10 MED ORDER — CHLORHEXIDINE GLUCONATE CLOTH 2 % EX PADS
6.0000 | MEDICATED_PAD | Freq: Every day | CUTANEOUS | Status: DC
  Administered 2023-08-10 – 2023-08-14 (×5): 6 via TOPICAL

## 2023-08-10 MED ORDER — ACETAMINOPHEN 650 MG RE SUPP
650.0000 mg | Freq: Four times a day (QID) | RECTAL | Status: DC | PRN
  Administered 2023-08-10: 650 mg via RECTAL
  Filled 2023-08-10: qty 1

## 2023-08-10 NOTE — Progress Notes (Signed)
Sitter remains in place pt remains nonverbal

## 2023-08-10 NOTE — Progress Notes (Signed)
PROGRESS NOTE    Christina Stokes  ZOX:[id number]  DOB: 1976/03/23  DOA: 08/08/2023 PCP: Vivien Presto, MD Outpatient Specialists:   Hospital course:  47 year old female with known polysubstance use, ongoing methamphetamine use, homelessness was admitted earlier this morning with apparent suicide attempt.  She was noted to have a suicide note on her an empty bottle of Flexeril.  On admission she was intended, poison control was called and recommendation was made for 72-hour observation for seizure activity and urinary retention.  Brain imaging was unrevealing.   Subjective:  Patient is sleeping heavily when I go in to see her.  She opens her eyes to sternal rub and asked for water.  She goes back to sleep shortly thereafter.   Objective: Vitals:   08/10/23 0430 08/10/23 0748 08/10/23 1203 08/10/23 1602  BP: 118/76 131/85 (!) 136/97 (!) 140/93  Pulse: (!) 109 (!) 103 (!) 106 (!) 105  Resp: 19 18 18 18   Temp: (!) 100.8 F (38.2 C) 99.7 F (37.6 C) 98.8 F (37.1 C) 98.6 F (37 C)  TempSrc: Axillary Oral Axillary Rectal  SpO2: 95% 95% 97% 96%  Weight:      Height:        Intake/Output Summary (Last 24 hours) at 08/10/2023 1814 Last data filed at 08/10/2023 1605 Gross per 24 hour  Intake 1393.23 ml  Output 2700 ml  Net -1306.77 ml   Filed Weights   08/08/23 2256 08/09/23 0445  Weight: 81.2 kg 74.2 kg     Exam:  General: Patient lying in bed sleeping heavily, opens her eyes and speaks coherently to sternal rub.  Goes back to sleep shortly thereafter CVS: S1-S2, regular  Respiratory:  decreased air entry bilaterally secondary to decreased inspiratory effort, rales at bases  GI: NABS, soft, NT  LE: Warm and well-perfused Neuro: GCS improved to 11, opens eyes to sternal rub, speaks but is confused, does not follow commands .  Data Reviewed:  Basic Metabolic Panel: Recent Labs  Lab 08/08/23 2307 08/08/23 2328 08/09/23 0713 08/09/23 1409 08/10/23 0300  NA  137 140  --  137 135  K 2.5* 2.5*  --  3.4* 3.7  CL 101 100  --  103 101  CO2 23  --   --  24 23  GLUCOSE 137* 136*  --  87 82  BUN 14 14  --  6 <5*  CREATININE 0.72 0.60 0.62 0.64 0.58  CALCIUM 8.4*  --   --  8.0* 8.4*  MG 2.0  --   --   --   --     CBC: Recent Labs  Lab 08/08/23 2307 08/08/23 2328 08/09/23 0713 08/10/23 0300  WBC 5.2  --  4.7 7.3  HGB 12.4 12.9 11.6* 12.0  HCT 38.1 38.0 34.4* 36.6  MCV 91.6  --  90.5 88.8  PLT 216  --  194 191     Scheduled Meds:  enoxaparin (LOVENOX) injection  40 mg Subcutaneous Q24H   Continuous Infusions:     Assessment & Plan:   Suicide attempt--IVC done in ED Toxic encephalopathy Toxic toxic encephalopathy is improving, GCS has increased from 6 to 8-11 today Patient found with suicide note and empty bottle of Flexeril at the scene UDS positive for methamphetamines, negative for salicylates Patient being observed under suicide precautions with sitter for seizure/urinary retention/QTc Psychiatry consult placed  Hypokalemia Resolved with repletion  Urinary retention Patient had urinary retention of 550 cc Patient has Foley in place at present  DVT prophylaxis: Lovenox Code Status: Full Family Communication: spoke with patient's daughter [place].     Studies: DG CHEST PORT 1 VIEW  Result Date: 08/10/2023 CLINICAL DATA:  Aspiration EXAM: PORTABLE CHEST 1 VIEW COMPARISON:  08/08/2023 FINDINGS: The heart size and mediastinal contours are within normal limits. Both lungs are clear. The visualized skeletal structures are unremarkable. IMPRESSION: No acute abnormality of the lungs in AP portable projection. Electronically Signed   By: Jearld Lesch M.D.   On: 08/10/2023 17:06   CT Cervical Spine Wo Contrast  Result Date: 08/09/2023 CLINICAL DATA:  Facial trauma, blunt, possible seizure, overdose on Flexeril, altered mental status, found on sidewalk. EXAM: CT CERVICAL SPINE WITHOUT CONTRAST TECHNIQUE: Multidetector  CT imaging of the cervical spine was performed without intravenous contrast. Multiplanar CT image reconstructions were also generated. RADIATION DOSE REDUCTION: This exam was performed according to the departmental dose-optimization program which includes automated exposure control, adjustment of the mA and/or kV according to patient size and/or use of iterative reconstruction technique. COMPARISON:  None Available. FINDINGS: Alignment: Rightward rotation of the atlas in relation to the dens is presumed positional though clinical correlation is recommended to exclude rotary subluxation. Loss of lordosis is likely positional. Skull base and vertebrae: No acute fracture. Soft tissues and spinal canal: No prevertebral fluid or swelling. No visible canal hematoma. Disc levels: Mild multilevel spondylosis. No significant spinal canal narrowing. Upper chest: No acute abnormality. Other: Nasopharyngeal tube. IMPRESSION: 1. Rightward rotation of the atlas in relation to the dens is presumed positional though clinical correlation is recommended to exclude rotary subluxation. 2.  No acute fracture. Electronically Signed   By: Minerva Fester M.D.   On: 08/09/2023 01:07   CT HEAD WO CONTRAST ( )  Result Date: 08/09/2023 CLINICAL DATA:  Seizure, overdose EXAM: CT HEAD WITHOUT CONTRAST TECHNIQUE: Multidetector CT imaging of the head was performed following the standard protocol without intravenous contrast. RADIATION DOSE REDUCTION: This exam was performed according to the departmental dose-optimization program which includes automated exposure control, adjustment of the mA and/or kV according to patient size and/or use of iterative reconstruction technique. COMPARISON:  None Available. FINDINGS: CT HEAD FINDINGS Brain: The examination is markedly limited by motion artifact. No large acute intracranial hemorrhage identified. No grossly abnormal mass effect or midline shift. Ventricular size is normal. Vascular: Limited by  motion artifact Skull: No definite fracture identified.  Marked motion artifact. Sinuses/Orbits: Visualized paranasal sinuses are clear. Orbits are poorly evaluated due to motion artifact. Other: Mastoid air cells and middle ear cavities are clear. IMPRESSION: 1. Markedly limited examination due to motion artifact. No large acute intracranial hemorrhage identified. No grossly abnormal mass effect or midline shift. Electronically Signed   By: Helyn Numbers M.D.   On: 08/09/2023 01:04   DG Chest Port 1 View  Result Date: 08/08/2023 CLINICAL DATA:  Altered mental status. EXAM: PORTABLE CHEST 1 VIEW COMPARISON:  07/17/2021 FINDINGS: Lung volumes are low. Normal heart size for technique. Minor retrocardiac atelectasis. No focal airspace disease, large pleural effusion or pneumothorax. No acute osseous abnormalities are seen. IMPRESSION: Low lung volumes with minor retrocardiac atelectasis. Electronically Signed   By: Narda Rutherford M.D.   On: 08/08/2023 23:49    Principal Problem:   Overdose     Horatio Pel Orma Flaming, [facility]  If 7PM-7AM, please contact night-coverage [email or web address]   LOS: 1 day

## 2023-08-10 NOTE — Progress Notes (Signed)
Sitter at bedside.

## 2023-08-10 NOTE — Plan of Care (Signed)

## 2023-08-10 NOTE — Consult Note (Signed)
Patient seen in her hospital room but she is unable to participate in psychiatric evaluation at this time due to lethargy, sedation and excessive sleepiness. Plan:  -Psychiatric consult service will see patient tomorrow for evaluation  Recommendation:  Re-consult psych service when patient is awake and alert  Thedore Mins, MD Attending psychiatrist

## 2023-08-10 NOTE — Progress Notes (Signed)
Pt has sitter at the bedside. Pt responds to pain. Pt nonverbal at this time.  08/10/23 0748  Vitals  Temp 99.7 F (37.6 C)  Temp Source Oral  BP 131/85  MAP (mmHg) 99  BP Location Left Arm  BP Method Automatic  Patient Position (if appropriate) Lying  Pulse Rate (!) 103  Pulse Rate Source Monitor  ECG Heart Rate (!) 106  Resp 18  MEWS COLOR  MEWS Score Color Yellow  Oxygen Therapy  SpO2 95 %  O2 Device Nasal Cannula  MEWS Score  MEWS Temp 0  MEWS Systolic 0  MEWS Pulse 1  MEWS RR 0  MEWS LOC 2  MEWS Score 3

## 2023-08-10 NOTE — Progress Notes (Signed)
Pt has had no change in status sitter remains in place

## 2023-08-10 NOTE — Progress Notes (Signed)
Pt has had no change in status MD attempted consult but patient unable to participate at the time  08/10/23 1203  Vitals  Temp 98.8 F (37.1 C)  Temp Source Axillary  BP (!) 136/97  MAP (mmHg) 107  BP Location Left Arm  BP Method Automatic  Patient Position (if appropriate) Lying  Pulse Rate (!) 106  Pulse Rate Source Monitor  ECG Heart Rate (!) 106  Resp 18  MEWS COLOR  MEWS Score Color Yellow  Oxygen Therapy  SpO2 97 %  O2 Device Nasal Cannula  MEWS Score  MEWS Temp 0  MEWS Systolic 0  MEWS Pulse 1  MEWS RR 0  MEWS LOC 2  MEWS Score 3

## 2023-08-10 NOTE — Progress Notes (Signed)
Daughter now visiting at the bedside requesting updates

## 2023-08-11 ENCOUNTER — Other Ambulatory Visit: Payer: Self-pay

## 2023-08-11 DIAGNOSIS — T1491XA Suicide attempt, initial encounter: Secondary | ICD-10-CM

## 2023-08-11 NOTE — Plan of Care (Signed)

## 2023-08-11 NOTE — Plan of Care (Signed)

## 2023-08-11 NOTE — TOC Initial Note (Signed)
Transition of Care [facility]) - Initial/Assessment Note    Patient Details  Name: Mikhala Kenan MRN: [id number] Date of Birth: 03-26-1976  Transition [facility]) CM/SW Contact:    Leone Haven, RN Phone Number: 08/11/2023, 2:13 PM  Clinical Narrative:                 Homeless, AMS, OD, Psych to eval , IVC'd. Per MD note.        Patient Goals and CMS Choice            Expected Discharge Plan and Services                                              Prior Living Arrangements/Services                       Activities of Daily Living   ADL Screening (condition at time of admission) Is the patient deaf or have difficulty hearing?: No Does the patient have difficulty seeing, even when wearing glasses/contacts?: No Does the patient have difficulty concentrating, remembering, or making decisions?: Yes  Permission Sought/Granted                  Emotional Assessment              Admission diagnosis:  Overdose [T50.901A] Patient Active Problem List   Diagnosis Date Noted   Overdose 08/09/2023   PCP:  Vivien Presto, MD Pharmacy:   [facility] [place] [facility] [address], [address] [place] [address] Phone: [phone] Fax: [phone]  [id number] - [place], [place] - [address] AT [facility] OF [address] [place] [address] Phone: [phone] Fax: [phone]  [facility] - [place], [place] - [address] [place] [address] Phone: [phone] Fax: [phone]  [facility] [address] [place] [address] Phone: [phone] Fax: [phone]     Social Determinants of Health (SDOH) Social History: SDOH Screenings   Food Insecurity: No Food Insecurity (12/30/2022)   Received from [facility]  Housing: Patient Unable To Answer (08/09/2023)  Transportation  Needs: No Transportation Needs (12/30/2022)   Received from [facility]  Utilities: Not At Risk (12/30/2022)   Received from [facility]  Financial Resource Strain: Low Risk  (12/30/2022)   Received from [facility]  Physical Activity: Insufficiently Active (12/30/2022)   Received from [facility]  Social Connections: Socially Integrated (12/30/2022)   Received from [facility]  Stress: Patient Declined (12/30/2022)   Received from [facility]  Recent Concern: Stress - Stress Concern Present (12/02/2022)   Received from [facility]  Tobacco Use: Medium Risk (08/08/2023)   SDOH Interventions:     Readmission Risk Interventions     No data to display
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
## 2023-08-11 NOTE — Progress Notes (Signed)
PROGRESS NOTE    Christina Stokes  YNW:[id number]  DOB: June 12, 1976  DOA: 08/08/2023 PCP: Vivien Presto, MD Outpatient Specialists:   Hospital course:  47 year old female with known polysubstance use, ongoing methamphetamine use, homelessness was admitted with apparent suicide attempt.  She was noted to have a suicide note on her an empty bottle of Flexeril.  On admission she was obtunded, poison control was called and recommendation was made for 72-hour observation for seizure activity and urinary retention.  Brain imaging was unrevealing. Remains in the hospital.  Still difficult to keep awake and interactive.  C-collar in place. Skeletal survey was negative except suspected rotation dens?   Subjective:  Patient seen and examined.  Remains with one-to-one sitter.  She wakes up and answers 1-2 words and goes back to sleep.  Follows simple commands on strong stimuli.   Objective: Vitals:   08/10/23 2032 08/10/23 2309 08/11/23 0352 08/11/23 1057  BP: 122/82 (!) 131/55 116/67 119/74  Pulse:    100  Resp: 20 18 19 18   Temp: 98 F (36.7 C) 98.4 F (36.9 C) 97.9 F (36.6 C)   TempSrc: Axillary Axillary Axillary   SpO2: 97% 97% 98% 98%  Weight:      Height:        Intake/Output Summary (Last 24 hours) at 08/11/2023 1305 Last data filed at 08/11/2023 0915 Gross per 24 hour  Intake --  Output 1300 ml  Net -1300 ml   Filed Weights   08/08/23 2256 08/09/23 0445  Weight: 81.2 kg 74.2 kg     Exam:   .  Data Reviewed:  Basic Metabolic Panel: Recent Labs  Lab 08/08/23 2307 08/08/23 2328 08/09/23 0713 08/09/23 1409 08/10/23 0300  NA 137 140  --  137 135  K 2.5* 2.5*  --  3.4* 3.7  CL 101 100  --  103 101  CO2 23  --   --  24 23  GLUCOSE 137* 136*  --  87 82  BUN 14 14  --  6 <5*  CREATININE 0.72 0.60 0.62 0.64 0.58  CALCIUM 8.4*  --   --  8.0* 8.4*  MG 2.0  --   --   --   --     CBC: Recent Labs  Lab 08/08/23 2307 08/08/23 2328 08/09/23 0713  08/10/23 0300  WBC 5.2  --  4.7 7.3  HGB 12.4 12.9 11.6* 12.0  HCT 38.1 38.0 34.4* 36.6  MCV 91.6  --  90.5 88.8  PLT 216  --  194 191     Scheduled Meds:  Chlorhexidine Gluconate Cloth  6 each Topical Q0600   enoxaparin (LOVENOX) injection  40 mg Subcutaneous Q24H   Continuous Infusions:     Assessment & Plan:   Suicide attempt--IVC done in ED Toxic encephalopathy Still remains sleepy but hemodynamically stable.   Patient found with suicide note and empty bottle of Flexeril at the scene UDS positive for methamphetamines, negative for salicylates Patient being observed under suicide precautions with sitter for seizure/urinary retention/QTc Psychiatry following.  Patient is still unable to communicate.  Hypokalemia Resolved with repletion  Urinary retention Patient had urinary retention of 550 cc Patient has Foley in place at present     DVT prophylaxis: Lovenox Code Status: Full Family Communication: None.     Studies: DG CHEST PORT 1 VIEW  Result Date: 08/10/2023 CLINICAL DATA:  Aspiration EXAM: PORTABLE CHEST 1 VIEW COMPARISON:  08/08/2023 FINDINGS: The heart size and mediastinal contours are within normal  limits. Both lungs are clear. The visualized skeletal structures are unremarkable. IMPRESSION: No acute abnormality of the lungs in AP portable projection. Electronically Signed   By: Jearld Lesch M.D.   On: 08/10/2023 17:06    Principal Problem:   Overdose     Dorcas Carrow, 2023  LOS: 2 days

## 2023-08-11 NOTE — Progress Notes (Signed)
Sitter remains in place. Pt continues to sleep heavily but appears to have been moving or thrashing about in bed based off of her position
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
## 2023-08-12 ENCOUNTER — Encounter (HOSPITAL_COMMUNITY): Payer: Self-pay | Admitting: Internal Medicine

## 2023-08-12 DIAGNOSIS — T1491XA Suicide attempt, initial encounter: Secondary | ICD-10-CM | POA: Diagnosis not present

## 2023-08-12 DIAGNOSIS — F1910 Other psychoactive substance abuse, uncomplicated: Principal | ICD-10-CM

## 2023-08-12 NOTE — Plan of Care (Signed)
  Problem: Education: Goal: Knowledge of General Education information will improve Description: Including pain rating scale, medication(s)/side effects and non-pharmacologic comfort measures Outcome: Progressing   Problem: Health Behavior/Discharge Planning: Goal: Ability to manage health-related needs will improve Outcome: Progressing   Problem: Clinical Measurements: Goal: Ability to maintain clinical measurements within normal limits will improve Outcome: Progressing Goal: Will remain free from infection Outcome: Progressing Goal: Diagnostic test results will improve Outcome: Progressing Goal: Respiratory complications will improve Outcome: Progressing Goal: Cardiovascular complication will be avoided Outcome: Progressing   Problem: Coping: Goal: Level of anxiety will decrease Outcome: Progressing   Problem: Safety: Goal: Ability to remain free from injury will improve Outcome: Progressing   Problem: Skin Integrity: Goal: Risk for impaired skin integrity will decrease Outcome: Progressing   

## 2023-08-12 NOTE — Progress Notes (Signed)
PROGRESS NOTE    Christina Stokes  ZOX:[id number]  DOB: September 23, 1976  DOA: 08/08/2023 PCP: Vivien Presto, MD Outpatient Specialists:   Hospital course:  47 year old female with known polysubstance use, ongoing methamphetamine use, homelessness was admitted with apparent suicide attempt.  She was noted to have a suicide note on her with empty bottle of Flexeril.  On admission she was obtunded, poison control was called and recommendation was made for 72-hour observation for seizure activity and urinary retention.  Brain imaging was unrevealing. Remains in the hospital.  Still difficult to keep awake and interactive.  C-collar in place. Skeletal survey was negative except suspected rotation dens? Patient is more awake now but still goes back to sleep easily.  Unable to participate fully for psychiatric interview.   Subjective:  Patient was seen and examined.  She was initially sleepy.  She woke up and kept up conversation.  Patient was able to drink water and eat. Tells me that she will go back to her husband when we discharge her.  She is also aware that psychiatry is following her.  Patient is mostly alert awake, easily goes back to sleep.  She is oriented to herself and family.  She still has occasional confusion.  Still difficult to engage fully in conversation.   Objective: Vitals:   08/12/23 0400 08/12/23 0600 08/12/23 0753 08/12/23 1146  BP: 125/84  139/82 135/68  Pulse:   86 86  Resp: 16 16 18 18   Temp: 98.2 F (36.8 C)  97.6 F (36.4 C) 97.6 F (36.4 C)  TempSrc: Axillary  Axillary Axillary  SpO2: 96%  96% 95%  Weight: 78.6 kg     Height:        Intake/Output Summary (Last 24 hours) at 08/12/2023 1340 Last data filed at 08/12/2023 1315 Gross per 24 hour  Intake 640 ml  Output 850 ml  Net -210 ml   Filed Weights   08/08/23 2256 08/09/23 0445 08/12/23 0400  Weight: 81.2 kg 74.2 kg 78.6 kg     Exam:  General: Mostly sleepy.  Looks comfortable.  On room  air. Cardiovascular: S1-S2 normal.  Regular rate rhythm. Respiratory: Bilateral clear.  No added sounds. Gastrointestinal: Soft and nontender.  Bowel sound present.  Foley catheter with clear urine. Ext: No swelling or edema.  No cyanosis. Neuro: Mostly sleepy.  Alert awake on stimulation.  Answers appropriately.  Follows commands. Moves all extremities with equal strength C collar in place.    .  Data Reviewed:  Basic Metabolic Panel: Recent Labs  Lab 08/08/23 2307 08/08/23 2328 08/09/23 0713 08/09/23 1409 08/10/23 0300  NA 137 140  --  137 135  K 2.5* 2.5*  --  3.4* 3.7  CL 101 100  --  103 101  CO2 23  --   --  24 23  GLUCOSE 137* 136*  --  87 82  BUN 14 14  --  6 <5*  CREATININE 0.72 0.60 0.62 0.64 0.58  CALCIUM 8.4*  --   --  8.0* 8.4*  MG 2.0  --   --   --   --     CBC: Recent Labs  Lab 08/08/23 2307 08/08/23 2328 08/09/23 0713 08/10/23 0300  WBC 5.2  --  4.7 7.3  HGB 12.4 12.9 11.6* 12.0  HCT 38.1 38.0 34.4* 36.6  MCV 91.6  --  90.5 88.8  PLT 216  --  194 191     Scheduled Meds:  Chlorhexidine Gluconate Cloth  6  each Topical Q0600   enoxaparin (LOVENOX) injection  40 mg Subcutaneous Q24H   Continuous Infusions:     Assessment & Plan:   Suicide attempt--IVC done in ED Toxic encephalopathy Still remains sleepy but hemodynamically stable.   Patient found with suicide note and empty bottle of Flexeril at the scene UDS positive for methamphetamines, negative for salicylates Patient being observed under suicide precautions with sitter for seizure/urinary retention/QTc Psychiatry following.  Anticipating patient will need inpatient behavioral therapy.  Hypokalemia Resolved with repletion.  Will recheck electrolytes.  Urinary retention Patient had urinary retention of 550 cc Patient has Foley in place at present Hopefully we can take her Foley out before discharge.  This was mostly related to sleepiness.     DVT prophylaxis: Lovenox Code  Status: Full Family Communication: None.     Studies: No results found.  Principal Problem:   Overdose     Dorcas Carrow, 2023  LOS: 3 days

## 2023-08-13 DIAGNOSIS — F1910 Other psychoactive substance abuse, uncomplicated: Secondary | ICD-10-CM | POA: Diagnosis not present

## 2023-08-13 DIAGNOSIS — R338 Other retention of urine: Secondary | ICD-10-CM

## 2023-08-13 DIAGNOSIS — T50902A Poisoning by unspecified drugs, medicaments and biological substances, intentional self-harm, initial encounter: Secondary | ICD-10-CM | POA: Diagnosis not present

## 2023-08-13 DIAGNOSIS — T1491XA Suicide attempt, initial encounter: Secondary | ICD-10-CM | POA: Diagnosis not present

## 2023-08-13 LAB — COMPREHENSIVE METABOLIC PANEL
ALT: 18 U/L (ref 0–44)
AST: 15 U/L (ref 15–41)
Albumin: 3.1 g/dL — ABNORMAL LOW (ref 3.5–5.0)
Alkaline Phosphatase: 58 U/L (ref 38–126)
Anion gap: 9 (ref 5–15)
BUN: 18 mg/dL (ref 6–20)
CO2: 27 mmol/L (ref 22–32)
Calcium: 9.3 mg/dL (ref 8.9–10.3)
Chloride: 100 mmol/L (ref 98–111)
Creatinine, Ser: 0.77 mg/dL (ref 0.44–1.00)
GFR, Estimated: >60 mL/min (ref >60)
Glucose, Bld: 113 mg/dL — ABNORMAL HIGH (ref 70–99)
Potassium: 3.8 mmol/L (ref 3.5–5.1)
Sodium: 136 mmol/L (ref 135–145)
Total Bilirubin: 0.6 mg/dL (ref 0.3–1.2)
Total Protein: 6.7 g/dL (ref 6.5–8.1)

## 2023-08-13 LAB — CBC WITH DIFFERENTIAL/PLATELET
Abs Immature Granulocytes: 0.02 10*3/uL (ref 0.00–0.07)
Basophils Absolute: 0 10*3/uL (ref 0.0–0.1)
Basophils Relative: 0 %
Eosinophils Absolute: 0.1 10*3/uL (ref 0.0–0.5)
Eosinophils Relative: 3 %
HCT: 43.7 % (ref 36.0–46.0)
Hemoglobin: 14.2 g/dL (ref 12.0–15.0)
Immature Granulocytes: 0 %
Lymphocytes Relative: 31 %
Lymphs Abs: 1.4 10*3/uL (ref 0.7–4.0)
MCH: 28.7 pg (ref 26.0–34.0)
MCHC: 32.5 g/dL (ref 30.0–36.0)
MCV: 88.3 fL (ref 80.0–100.0)
Monocytes Absolute: 0.6 10*3/uL (ref 0.1–1.0)
Monocytes Relative: 14 %
Neutro Abs: 2.4 10*3/uL (ref 1.7–7.7)
Neutrophils Relative %: 52 %
Platelets: 237 10*3/uL (ref 150–400)
RBC: 4.95 MIL/uL (ref 3.87–5.11)
RDW: 12.3 % (ref 11.5–15.5)
WBC: 4.5 10*3/uL (ref 4.0–10.5)
nRBC: 0 % (ref 0.0–0.2)

## 2023-08-13 LAB — MAGNESIUM: Magnesium: 2.1 mg/dL (ref 1.7–2.4)

## 2023-08-13 LAB — PHOSPHORUS: Phosphorus: 3.5 mg/dL (ref 2.5–4.6)

## 2023-08-13 MED ORDER — ESCITALOPRAM OXALATE 10 MG PO TABS
10.0000 mg | ORAL_TABLET | Freq: Every day | ORAL | Status: DC
  Administered 2023-08-13 – 2023-08-14 (×2): 10 mg via ORAL
  Filled 2023-08-13 (×2): qty 1

## 2023-08-13 NOTE — TOC Progression Note (Signed)
Transition of Care [facility]) - Progression Note    Patient Details  Name: Christina Stokes MRN: [id number] Date of Birth: 1976-09-18  Transition of Care [facility]) CM/SW Contact  Leone Haven, RN Phone Number: 08/13/2023, 2:51 PM  Clinical Narrative:    Per psych rec inpt psych, she is from home with spouse and daughter, [place].  He has PCP Roe Rutherford, she has insurance on file, she currently has no HH or DME services.  She gives [place] permission to speak with her daughter [place]. She is IVC'd, CSW aware.        Expected Discharge Plan and Services                                               Social Determinants of Health (SDOH) Interventions SDOH Screenings   Food Insecurity: No Food Insecurity (12/30/2022)   Received from [facility]  Housing: Patient Unable To Answer (08/09/2023)  Transportation Needs: No Transportation Needs (12/30/2022)   Received from [facility]  Utilities: Not At Risk (12/30/2022)   Received from [facility]  Financial Resource Strain: Low Risk  (12/30/2022)   Received from [facility]  Physical Activity: Insufficiently Active (12/30/2022)   Received from [facility]  Social Connections: Socially Integrated (12/30/2022)   Received from [facility]  Stress: Patient Declined (12/30/2022)   Received from [facility]  Recent Concern: Stress - Stress Concern Present (12/02/2022)   Received from [facility]  Tobacco Use: Medium Risk (08/08/2023)    Readmission Risk Interventions     No data to display

## 2023-08-13 NOTE — Progress Notes (Signed)
PROGRESS NOTE    Christina Stokes  ZOX:[id number] DOB: 1976-04-06 DOA: 08/08/2023 PCP: Vivien Presto, MD  Subjective: Pt seen and examined. AxOx4. No pain with palpation of her c-spine spinous processes. No stepoffs. Pt able to gently flex and extend and rotate neck without pain. Pt's hard c-collar removed by this Clinical research associate.  Foley catheter removed this AM. Pt has yet to urinate on her own.   Hospital Course: HPI:  Christina Stokes is a 47 y.o. female with medical history significant for iron deficiency anemia, chronic anxiety/depression, tobacco use disorder, who presented to the ED via EMS due to suicidal attempt with overdose of prescribed medications.  The patient was found by a bystander having what appeared to be a seizure like activity.  EMS was activated and upon arrival, the patient had a suicidal note on her and an empty bottle of Flexeril.  Was administered Narcan en route with some response.   In the ED, somnolent and protecting her airway.  Response to painful stimuli.  Poison control was notified by pharmacy.  Recommend 72-hour observation due to Wellbutrin and to monitor for seizure activity and urinary retention.  Admitted by [facility], hospitalist service.  Significant Events: Admitted 08/08/2023 for suicide attempt by overdose   Significant Labs: Admitting UDS positive for amphetamines  Significant Imaging Studies: CT c-spine showed Rightward rotation of the atlas in relation to the dens is presumed positional though clinical correlation is recommended to exclude rotary subluxation.  Antibiotic Therapy: Anti-infectives (From admission, onward)    None       Procedures:   Consultants: Psych    Assessment and Plan: * Overdose Pt admitted for overdose on flexeril. Pt seen by psych consult. Pt will was in the very sleepy for the last 3 days of her hospitalization but has since woken up.  Psychiatry wants her to go to inpatient psych.  C-collar removed this morning at  the patient is alert and orient x 3.  No step-offs, pain with palpation over spinous process.  Patient able to gently flex and extend her neck and rotate her head left and right without any pain.  C-collar removed.  Pt is medically stable for transfer to inpatient [facility].  Suicide attempt [facility]) Patient seen by psychiatry.  She still is under involuntary commitment orders.  Psychiatry will determine if IVC can be discontinued.  She remains with one-to-one sitter.  She will likely need inpatient behavioral health admission.  Acute urinary retention Likely due to overdose of Flexeril.  She had a Foley catheter placed on admission.  Foley catheter was discontinued on 08/13/2023.  Polysubstance abuse (HCC) Stable.  Patient is UDS positive for amphetamines.   DVT prophylaxis: enoxaparin (LOVENOX) injection 40 mg Start: 08/09/23 1000    Code Status: Full Code Family Communication: no family at bedside Disposition Plan: inpatient [facility] vs outpatient mental health vs home Reason for continuing need for hospitalization: psych working on transfer to [facility]  Objective: Vitals:   08/12/23 2130 08/12/23 2131 08/13/23 0005 08/13/23 0813  BP: 116/78 116/78 123/81 124/75  Pulse:  99 99 95  Resp:   18 18  Temp: 98.2 F (36.8 C)  98.5 F (36.9 C) 97.9 F (36.6 C)  TempSrc: Oral  Oral Oral  SpO2:  96% 97% 98%  Weight:      Height:        Intake/Output Summary (Last 24 hours) at 08/13/2023 1114 Last data filed at 08/13/2023 0912 Gross per 24 hour  Intake 380 ml  Output 900  ml  Net -520 ml   Filed Weights   08/08/23 2256 08/09/23 0445 08/12/23 0400  Weight: 81.2 kg 74.2 kg 78.6 kg    Examination:  Physical Exam Vitals and nursing note reviewed.  HENT:     Head: Normocephalic.  Eyes:     General: No scleral icterus. Cardiovascular:     Rate and Rhythm: Normal rate and regular rhythm.     Pulses: Normal pulses.  Pulmonary:     Effort: Pulmonary effort is normal. No respiratory distress.      Breath sounds: Normal breath sounds. No rales.  Abdominal:     General: Abdomen is flat. Bowel sounds are normal.     Palpations: Abdomen is soft.  Musculoskeletal:     Right lower leg: No edema.     Left lower leg: No edema.  Skin:    General: Skin is warm and dry.     Capillary Refill: Capillary refill takes less than 2 seconds.  Neurological:     General: No focal deficit present.     Mental Status: She is alert and oriented to person, place, and time.     Data Reviewed: I have personally reviewed following labs and imaging studies  CBC: Recent Labs  Lab 08/08/23 2307 08/08/23 2328 08/09/23 0713 08/10/23 0300 08/13/23 0308  WBC 5.2  --  4.7 7.3 4.5  NEUTROABS  --   --   --   --  2.4  HGB 12.4 12.9 11.6* 12.0 14.2  HCT 38.1 38.0 34.4* 36.6 43.7  MCV 91.6  --  90.5 88.8 88.3  PLT 216  --  194 191 237   Basic Metabolic Panel: Recent Labs  Lab 08/08/23 2307 08/08/23 2328 08/09/23 0713 08/09/23 1409 08/10/23 0300 08/13/23 0308  NA 137 140  --  137 135 136  K 2.5* 2.5*  --  3.4* 3.7 3.8  CL 101 100  --  103 101 100  CO2 23  --   --  24 23 27   GLUCOSE 137* 136*  --  87 82 113*  BUN 14 14  --  6 <5* 18  CREATININE 0.72 0.60 0.62 0.64 0.58 0.77  CALCIUM 8.4*  --   --  8.0* 8.4* 9.3  MG 2.0  --   --   --   --  2.1  PHOS  --   --   --   --   --  3.5   GFR: Estimated Creatinine Clearance: 87.3 mL/min (by C-G formula based on SCr of 0.77 mg/dL). Liver Function Tests: Recent Labs  Lab 08/08/23 2307 08/13/23 0308  AST 26 15  ALT 29 18  ALKPHOS 67 58  BILITOT 0.8 0.6  PROT 6.7 6.7  ALBUMIN 3.8 3.1*   CBG: Recent Labs  Lab 08/08/23 2316  GLUCAP 134*   Urine Drug Screen: Lab Results  Component Value Date/Time   LABOPIA NONE DETECTED 08/09/2023 01:09 AM   COCAINSCRNUR NONE DETECTED 08/09/2023 01:09 AM   LABBENZ NONE DETECTED 08/09/2023 01:09 AM   AMPHETMU POSITIVE (A) 08/09/2023 01:09 AM   THCU NONE DETECTED 08/09/2023 01:09 AM   LABBARB NONE DETECTED  08/09/2023 01:09 AM      Radiology Studies: No results found.  Scheduled Meds:  Chlorhexidine Gluconate Cloth  6 each Topical Q0600   enoxaparin (LOVENOX) injection  40 mg Subcutaneous Q24H   escitalopram  10 mg Oral Daily   Continuous Infusions:   LOS: 4 days   Time spent: 40 minutes  Carollee Herter,  DO  [facility]  08/13/2023, 11:14 AM

## 2023-08-13 NOTE — Assessment & Plan Note (Signed)
Stable.  Patient is UDS positive for amphetamines.

## 2023-08-13 NOTE — Assessment & Plan Note (Addendum)
Pt admitted for overdose on flexeril. Pt seen by psych consult. Pt will was in the very sleepy for the last 3 days of her hospitalization but has since woken up.  Psychiatry wants her to go to inpatient psych.  C-collar removed this morning at the patient is alert and orient x 3.  No step-offs, pain with palpation over spinous process.  Patient able to gently flex and extend her neck and rotate her head left and right without any pain.  C-collar removed.  Pt is medically stable for transfer to inpatient [facility].

## 2023-08-13 NOTE — Hospital Course (Signed)
HPI:  Christina Stokes is a 47 y.o. female with medical history significant for iron deficiency anemia, chronic anxiety/depression, tobacco use disorder, who presented to the ED via EMS due to suicidal attempt with overdose of prescribed medications.  The patient was found by a bystander having what appeared to be a seizure like activity.  EMS was activated and upon arrival, the patient had a suicidal note on her and an empty bottle of Flexeril.  Was administered Narcan en route with some response.   In the ED, somnolent and protecting her airway.  Response to painful stimuli.  Poison control was notified by pharmacy.  Recommend 72-hour observation due to Wellbutrin and to monitor for seizure activity and urinary retention.  Admitted by [facility], hospitalist service.  Significant Events: Admitted 08/08/2023 for suicide attempt by overdose   Significant Labs: Admitting UDS positive for amphetamines  Significant Imaging Studies: CT c-spine showed Rightward rotation of the atlas in relation to the dens is presumed positional though clinical correlation is recommended to exclude rotary subluxation.  Antibiotic Therapy: Anti-infectives (From admission, onward)    None       Procedures:   Consultants: Psych

## 2023-08-13 NOTE — Plan of Care (Signed)
  Problem: Education: Goal: Knowledge of General Education information will improve Description: Including pain rating scale, medication(s)/side effects and non-pharmacologic comfort measures Outcome: Progressing   Problem: Health Behavior/Discharge Planning: Goal: Ability to manage health-related needs will improve Outcome: Progressing   Problem: Clinical Measurements: Goal: Ability to maintain clinical measurements within normal limits will improve Outcome: Progressing   Problem: Activity: Goal: Risk for activity intolerance will decrease Outcome: Progressing   Problem: Nutrition: Goal: Adequate nutrition will be maintained Outcome: Progressing   Problem: Coping: Goal: Level of anxiety will decrease Outcome: Progressing   Problem: Elimination: Goal: Will not experience complications related to bowel motility Outcome: Progressing Goal: Will not experience complications related to urinary retention Outcome: Progressing
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
## 2023-08-13 NOTE — Assessment & Plan Note (Signed)
Likely due to overdose of Flexeril.  She had a Foley catheter placed on admission.  Foley catheter was discontinued on 08/13/2023. Pt able to urinate on her own now. Resolved.

## 2023-08-13 NOTE — Assessment & Plan Note (Signed)
Patient seen by psychiatry.  She still is under involuntary commitment orders.  Psychiatry will determine if IVC can be discontinued.  She remains with one-to-one sitter.  She will likely need inpatient behavioral health admission.

## 2023-08-13 NOTE — Subjective & Objective (Addendum)
Pt seen and examined. Pt able to urinate on her own.  Psych resident came into room while I was examining patient.  Pt is medically stable for discharge.

## 2023-08-14 ENCOUNTER — Encounter (HOSPITAL_COMMUNITY): Payer: Self-pay | Admitting: Psychiatry

## 2023-08-14 ENCOUNTER — Other Ambulatory Visit: Payer: Self-pay

## 2023-08-14 ENCOUNTER — Inpatient Hospital Stay (HOSPITAL_COMMUNITY)
Admission: AD | Admit: 2023-08-14 | Discharge: 2023-08-18 | DRG: 881 | Disposition: A | Discharge status: Home / Self Care | Payer: Medicaid Other | Source: Intra-hospital | Attending: Psychiatry | Admitting: Psychiatry

## 2023-08-14 DIAGNOSIS — F339 Major depressive disorder, recurrent, unspecified: Secondary | ICD-10-CM | POA: Diagnosis not present

## 2023-08-14 DIAGNOSIS — F17210 Nicotine dependence, cigarettes, uncomplicated: Secondary | ICD-10-CM | POA: Diagnosis present

## 2023-08-14 DIAGNOSIS — F17200 Nicotine dependence, unspecified, uncomplicated: Secondary | ICD-10-CM | POA: Diagnosis present

## 2023-08-14 DIAGNOSIS — K219 Gastro-esophageal reflux disease without esophagitis: Secondary | ICD-10-CM | POA: Diagnosis present

## 2023-08-14 DIAGNOSIS — F332 Major depressive disorder, recurrent severe without psychotic features: Principal | ICD-10-CM

## 2023-08-14 DIAGNOSIS — Z5900 Homelessness unspecified: Secondary | ICD-10-CM

## 2023-08-14 DIAGNOSIS — N390 Urinary tract infection, site not specified: Secondary | ICD-10-CM | POA: Diagnosis present

## 2023-08-14 DIAGNOSIS — G4700 Insomnia, unspecified: Secondary | ICD-10-CM | POA: Diagnosis present

## 2023-08-14 DIAGNOSIS — F1910 Other psychoactive substance abuse, uncomplicated: Secondary | ICD-10-CM | POA: Diagnosis not present

## 2023-08-14 DIAGNOSIS — R45851 Suicidal ideations: Secondary | ICD-10-CM | POA: Diagnosis present

## 2023-08-14 DIAGNOSIS — K5900 Constipation, unspecified: Secondary | ICD-10-CM | POA: Diagnosis present

## 2023-08-14 DIAGNOSIS — F1510 Other stimulant abuse, uncomplicated: Secondary | ICD-10-CM | POA: Diagnosis present

## 2023-08-14 DIAGNOSIS — Z91199 Patient's noncompliance with other medical treatment and regimen due to unspecified reason: Secondary | ICD-10-CM | POA: Diagnosis not present

## 2023-08-14 DIAGNOSIS — F329 Major depressive disorder, single episode, unspecified: Secondary | ICD-10-CM | POA: Diagnosis present

## 2023-08-14 DIAGNOSIS — F39 Unspecified mood [affective] disorder: Secondary | ICD-10-CM

## 2023-08-14 DIAGNOSIS — I10 Essential (primary) hypertension: Secondary | ICD-10-CM | POA: Diagnosis present

## 2023-08-14 DIAGNOSIS — Z888 Allergy status to other drugs, medicaments and biological substances status: Secondary | ICD-10-CM

## 2023-08-14 DIAGNOSIS — Z818 Family history of other mental and behavioral disorders: Secondary | ICD-10-CM | POA: Diagnosis not present

## 2023-08-14 DIAGNOSIS — Z79899 Other long term (current) drug therapy: Secondary | ICD-10-CM | POA: Diagnosis not present

## 2023-08-14 DIAGNOSIS — T1491XA Suicide attempt, initial encounter: Secondary | ICD-10-CM | POA: Diagnosis not present

## 2023-08-14 DIAGNOSIS — T50902A Poisoning by unspecified drugs, medicaments and biological substances, intentional self-harm, initial encounter: Secondary | ICD-10-CM | POA: Diagnosis not present

## 2023-08-14 DIAGNOSIS — R338 Other retention of urine: Secondary | ICD-10-CM | POA: Diagnosis not present

## 2023-08-14 MED ORDER — HYDROXYZINE HCL 25 MG PO TABS
25.0000 mg | ORAL_TABLET | Freq: Three times a day (TID) | ORAL | Status: DC | PRN
  Administered 2023-08-15 – 2023-08-17 (×3): 25 mg via ORAL
  Filled 2023-08-14 (×3): qty 1

## 2023-08-14 MED ORDER — ACETAMINOPHEN 325 MG PO TABS: 650.0000 mg | ORAL_TABLET | Freq: Four times a day (QID) | ORAL | Status: DC | PRN

## 2023-08-14 MED ORDER — LORAZEPAM 2 MG/ML IJ SOLN: 2.0000 mg | Freq: Three times a day (TID) | INTRAMUSCULAR | Status: DC | PRN

## 2023-08-14 MED ORDER — TRAZODONE HCL 50 MG PO TABS
50.0000 mg | ORAL_TABLET | Freq: Every evening | ORAL | Status: DC | PRN
  Administered 2023-08-14 – 2023-08-17 (×4): 50 mg via ORAL
  Filled 2023-08-14 (×4): qty 1

## 2023-08-14 MED ORDER — DIPHENHYDRAMINE HCL 25 MG PO CAPS: 50.0000 mg | ORAL_CAPSULE | Freq: Three times a day (TID) | ORAL | Status: DC | PRN

## 2023-08-14 MED ORDER — ALUM & MAG HYDROXIDE-SIMETH 200-200-20 MG/5ML PO SUSP
30.0000 mL | ORAL | Status: DC | PRN
  Administered 2023-08-16 – 2023-08-18 (×3): 30 mL via ORAL
  Filled 2023-08-14 (×3): qty 30

## 2023-08-14 MED ORDER — HALOPERIDOL LACTATE 5 MG/ML IJ SOLN: 5.0000 mg | Freq: Three times a day (TID) | INTRAMUSCULAR | Status: DC | PRN

## 2023-08-14 MED ORDER — NICOTINE 21 MG/24HR TD PT24
21.0000 mg | MEDICATED_PATCH | TRANSDERMAL | Status: DC
  Filled 2023-08-14 (×5): qty 1

## 2023-08-14 MED ORDER — ESCITALOPRAM OXALATE 10 MG PO TABS: 10.0000 mg | ORAL_TABLET | Freq: Every day | ORAL | Status: DC

## 2023-08-14 MED ORDER — MAGNESIUM HYDROXIDE 400 MG/5ML PO SUSP
30.0000 mL | Freq: Every day | ORAL | Status: DC | PRN
  Administered 2023-08-15: 30 mL via ORAL
  Filled 2023-08-14: qty 30

## 2023-08-14 MED ORDER — DIPHENHYDRAMINE HCL 50 MG/ML IJ SOLN: 50.0000 mg | Freq: Three times a day (TID) | INTRAMUSCULAR | Status: DC | PRN

## 2023-08-14 MED ORDER — ESCITALOPRAM OXALATE 10 MG PO TABS
10.0000 mg | ORAL_TABLET | Freq: Every day | ORAL | Status: DC
  Administered 2023-08-15 – 2023-08-18 (×4): 10 mg via ORAL
  Filled 2023-08-14 (×6): qty 1

## 2023-08-14 MED ORDER — LORAZEPAM 1 MG PO TABS: 2.0000 mg | ORAL_TABLET | Freq: Three times a day (TID) | ORAL | Status: DC | PRN

## 2023-08-14 MED ORDER — HALOPERIDOL 5 MG PO TABS: 5.0000 mg | ORAL_TABLET | Freq: Three times a day (TID) | ORAL | Status: DC | PRN

## 2023-08-14 NOTE — Assessment & Plan Note (Signed)
Pt seen by psych. Started on lexapro 10 mg daily.

## 2023-08-14 NOTE — Group Note (Signed)
[facility] LCSW Group Therapy Note   Group Date: 08/14/2023 Start Time: 1105 End Time: 1150  Type of Therapy/Topic:  Group Therapy:  Feelings about Diagnosis  Participation Level:  Did Not Attend      Description of Group:    This group will allow patients to explore their thoughts and feelings about diagnoses they have received. Patients will be guided to explore their level of understanding and acceptance of these diagnoses. Facilitator will encourage patients to process their thoughts and feelings about the reactions of others to their diagnosis, and will guide patients in identifying ways to discuss their diagnosis with significant others in their lives. This group will be process-oriented, with patients participating in exploration of their own experiences as well as giving and receiving support and challenge from other group members.   Therapeutic Goals: 1. Patient will demonstrate understanding of diagnosis as evidence by identifying two or more symptoms of the disorder:  2. Patient will be able to express two feelings regarding the diagnosis 3. Patient will demonstrate ability to communicate their needs through discussion and/or role plays      Therapeutic Modalities:   Cognitive Behavioral Therapy Brief Therapy Feelings Identification    Tajha Sammarco S Jaydee Ingman, LCSW
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
## 2023-08-14 NOTE — Plan of Care (Signed)
Nurse discussed coping skills with patient.  

## 2023-08-14 NOTE — Discharge Summary (Signed)
[facility] Phys[facility] Discharge Summary   Patient name: Christina Stokes  Admit date:     08/08/2023  Discharge date: 08/14/2023  Attending Physician: Darlin Drop [[id number]]  Discharge Physician: Carollee Herter   PCP: Vivien Presto, MD  Admitted From: Home  Disposition:   [facility]  Recommendations for Outpatient Follow-up:  Follow up with PCP in 1-2 weeks  Home Health:No Equipment/Devices: None  Discharge Condition:Stable CODE STATUS:FULL Diet recommendation: Regular Fluid Restriction: None  Hospital Summary: HPI:  Christina Stokes is a 47 y.o. female with medical history significant for iron deficiency anemia, chronic anxiety/depression, tobacco use disorder, who presented to the ED via EMS due to suicidal attempt with overdose of prescribed medications.  The patient was found by a bystander having what appeared to be a seizure like activity.  EMS was activated and upon arrival, the patient had a suicidal note on her and an empty bottle of Flexeril.  Was administered Narcan en route with some response.   In the ED, somnolent and protecting her airway.  Response to painful stimuli.  Poison control was notified by pharmacy.  Recommend 72-hour observation due to Wellbutrin and to monitor for seizure activity and urinary retention.  Admitted by [facility], hospitalist service.  Significant Events: Admitted 08/08/2023 for suicide attempt by overdose   Significant Labs: Admitting UDS positive for amphetamines  Significant Imaging Studies: CT c-spine showed Rightward rotation of the atlas in relation to the dens is presumed positional though clinical correlation is recommended to exclude rotary subluxation.  Antibiotic Therapy: Anti-infectives (From admission, onward)    None       Procedures:   Consultants: [facility] Course by Problem: * Overdose Pt admitted for overdose on flexeril. Pt seen by psych consult. Pt was very sleepy for the several days of her  hospitalization but has since woken up.  Psychiatry wants her to go to inpatient psych.  08-13-2023. C-collar removed this morning at the patient is alert and orient x 3.  No step-offs, pain with palpation over spinous process.  Patient able to gently flex and extend her neck and rotate her head left and right without any pain.  C-collar removed.  08-13-2023 Pt is medically stable for transfer to inpatient [facility]. 08-14-2023 pt remains medically stable for transfer to [facility]. Psych will renew her IVC. Orders written that pt may shower.  Suicide attempt [facility]) Patient seen by psychiatry.  She still is under involuntary commitment orders.  Psychiatry will determine if IVC can be discontinued.  She remains with one-to-one sitter.  She will likely need inpatient behavioral health admission.  Mood disorder (HCC) Pt seen by psych. Started on lexapro 10 mg daily.  Acute urinary retention Likely due to overdose of Flexeril.  She had a Foley catheter placed on admission.  Foley catheter was discontinued on 08/13/2023. Pt able to urinate on her own now. Resolved.  Polysubstance abuse (HCC) Stable.  Patient is UDS positive for amphetamines.   Discharge Diagnoses:  Principal Problem:   Overdose Active Problems:   Suicide attempt (HCC)   Polysubstance abuse (HCC)   Acute urinary retention   Mood disorder [facility])   Discharge Instructions  Discharge Instructions     Call MD for:  difficulty breathing, headache or visual disturbances   Complete by: As directed    Call MD for:  extreme fatigue   Complete by: As directed    Call MD for:  persistant dizziness or light-headedness   Complete by: As directed  Call MD for:  persistant nausea and vomiting   Complete by: As directed    Call MD for:  redness, tenderness, or signs of infection (pain, swelling, redness, odor or green/yellow discharge around incision site)   Complete by: As directed    Call MD for:  temperature >100.4   Complete by: As  directed    Diet - low sodium heart healthy   Complete by: As directed    Increase activity slowly   Complete by: As directed       Allergies as of 08/14/2023       Reactions   Glucosamine Other (See Comments)   GI Intolerance   Shellfish-derived Products Nausea And Vomiting   Chantix [varenicline]    Had suicide attempt < 1 week into therapy in Oct 2024.    Codeine Itching   Shellfish Allergy Nausea And Vomiting   Tramadol Hcl Nausea Only        Medication List     STOP taking these medications    ALPRAZolam 0.5 MG tablet Commonly known as: XANAX   buPROPion 150 MG 24 hr tablet Commonly known as: WELLBUTRIN XL   CELEXA PO   cyclobenzaprine 10 MG tablet Commonly known as: FLEXERIL   eszopiclone 2 MG Tabs tablet Commonly known as: LUNESTA   GOODY HEADACHE PO   lisinopril-hydrochlorothiazide 10-12.5 MG tablet Commonly known as: ZESTORETIC   naltrexone 50 MG tablet Commonly known as: DEPADE   rOPINIRole 0.5 MG tablet Commonly known as: REQUIP   varenicline 1 MG tablet Commonly known as: CHANTIX       TAKE these medications    escitalopram 10 MG tablet Commonly known as: LEXAPRO Take 1 tablet (10 mg total) by mouth daily. Start taking on: August 15, 2023        Allergies  Allergen Reactions   Glucosamine Other (See Comments)    GI Intolerance   Shellfish-Derived Products Nausea And Vomiting   Chantix [Varenicline]     Had suicide attempt < 1 week into therapy in Oct 2024.    Codeine Itching   Shellfish Allergy Nausea And Vomiting   Tramadol Hcl Nausea Only    Discharge Exam: Vitals:   08/14/23 0327 08/14/23 0808  BP: 106/68 (!) 136/94  Pulse: 88 86  Resp: 16 18  Temp: 98.1 F (36.7 C) 98.1 F (36.7 C)  SpO2: 99% 99%    Physical Exam Vitals and nursing note reviewed.  Constitutional:      General: She is not in acute distress.    Appearance: She is not toxic-appearing or diaphoretic.  HENT:     Head: Normocephalic and  atraumatic.  Cardiovascular:     Rate and Rhythm: Normal rate and regular rhythm.  Pulmonary:     Effort: Pulmonary effort is normal.     Breath sounds: Normal breath sounds. No rales.  Abdominal:     General: Bowel sounds are normal.     Palpations: Abdomen is soft.  Musculoskeletal:     Right lower leg: No edema.     Left lower leg: No edema.  Skin:    General: Skin is warm and dry.     Capillary Refill: Capillary refill takes less than 2 seconds.  Neurological:     General: No focal deficit present.     Mental Status: She is alert.     The results of significant diagnostics from this hospitalization (including imaging, microbiology, ancillary and laboratory) are listed below for reference.    Microbiology: No results  found for this or any previous visit (from the past 240 hour(s)).   Labs: BNP (last 3 results) No results for input(s): "BNP" in the last 8760 hours. Basic Metabolic Panel: Recent Labs  Lab 08/08/23 2307 08/08/23 2328 08/09/23 0713 08/09/23 1409 08/10/23 0300 08/13/23 0308  NA 137 140  --  137 135 136  K 2.5* 2.5*  --  3.4* 3.7 3.8  CL 101 100  --  103 101 100  CO2 23  --   --  24 23 27   GLUCOSE 137* 136*  --  87 82 113*  BUN 14 14  --  6 <5* 18  CREATININE 0.72 0.60 0.62 0.64 0.58 0.77  CALCIUM 8.4*  --   --  8.0* 8.4* 9.3  MG 2.0  --   --   --   --  2.1  PHOS  --   --   --   --   --  3.5   Liver Function Tests: Recent Labs  Lab 08/08/23 2307 08/13/23 0308  AST 26 15  ALT 29 18  ALKPHOS 67 58  BILITOT 0.8 0.6  PROT 6.7 6.7  ALBUMIN 3.8 3.1*   CBC: Recent Labs  Lab 08/08/23 2307 08/08/23 2328 08/09/23 0713 08/10/23 0300 08/13/23 0308  WBC 5.2  --  4.7 7.3 4.5  NEUTROABS  --   --   --   --  2.4  HGB 12.4 12.9 11.6* 12.0 14.2  HCT 38.1 38.0 34.4* 36.6 43.7  MCV 91.6  --  90.5 88.8 88.3  PLT 216  --  194 191 237   CBG: Recent Labs  Lab 08/08/23 2316  GLUCAP 134*   Urine Drug Screen: Lab Results  Component Value Date/Time    LABOPIA NONE DETECTED 08/09/2023 01:09 AM   COCAINSCRNUR NONE DETECTED 08/09/2023 01:09 AM   LABBENZ NONE DETECTED 08/09/2023 01:09 AM   AMPHETMU POSITIVE (A) 08/09/2023 01:09 AM   THCU NONE DETECTED 08/09/2023 01:09 AM   LABBARB NONE DETECTED 08/09/2023 01:09 AM   Urinalysis    Component Value Date/Time   COLORURINE YELLOW 08/09/2023 0109   APPEARANCEUR CLEAR 08/09/2023 0109   LABSPEC 1.023 08/09/2023 0109   PHURINE 6.0 08/09/2023 0109   GLUCOSEU NEGATIVE 08/09/2023 0109   HGBUR NEGATIVE 08/09/2023 0109   BILIRUBINUR NEGATIVE 08/09/2023 0109   KETONESUR 20 (A) 08/09/2023 0109   PROTEINUR NEGATIVE 08/09/2023 0109   NITRITE NEGATIVE 08/09/2023 0109   LEUKOCYTESUR NEGATIVE 08/09/2023 0109   Sepsis Labs Recent Labs  Lab 08/08/23 2307 08/09/23 0713 08/10/23 0300 08/13/23 0308  WBC 5.2 4.7 7.3 4.5   Procedures/Studies: DG CHEST PORT 1 VIEW  Result Date: 08/10/2023 CLINICAL DATA:  Aspiration EXAM: PORTABLE CHEST 1 VIEW COMPARISON:  08/08/2023 FINDINGS: The heart size and mediastinal contours are within normal limits. Both lungs are clear. The visualized skeletal structures are unremarkable. IMPRESSION: No acute abnormality of the lungs in AP portable projection. Electronically Signed   By: Jearld Lesch M.D.   On: 08/10/2023 17:06   CT Cervical Spine Wo Contrast  Result Date: 08/09/2023 CLINICAL DATA:  Facial trauma, blunt, possible seizure, overdose on Flexeril, altered mental status, found on sidewalk. EXAM: CT CERVICAL SPINE WITHOUT CONTRAST TECHNIQUE: Multidetector CT imaging of the cervical spine was performed without intravenous contrast. Multiplanar CT image reconstructions were also generated. RADIATION DOSE REDUCTION: This exam was performed according to the departmental dose-optimization program which includes automated exposure control, adjustment of the mA and/or kV according to patient size and/or use of iterative  reconstruction technique. COMPARISON:  None Available.  FINDINGS: Alignment: Rightward rotation of the atlas in relation to the dens is presumed positional though clinical correlation is recommended to exclude rotary subluxation. Loss of lordosis is likely positional. Skull base and vertebrae: No acute fracture. Soft tissues and spinal canal: No prevertebral fluid or swelling. No visible canal hematoma. Disc levels: Mild multilevel spondylosis. No significant spinal canal narrowing. Upper chest: No acute abnormality. Other: Nasopharyngeal tube. IMPRESSION: 1. Rightward rotation of the atlas in relation to the dens is presumed positional though clinical correlation is recommended to exclude rotary subluxation. 2.  No acute fracture. Electronically Signed   By: Minerva Fester M.D.   On: 08/09/2023 01:07   CT HEAD WO CONTRAST ( )  Result Date: 08/09/2023 CLINICAL DATA:  Seizure, overdose EXAM: CT HEAD WITHOUT CONTRAST TECHNIQUE: Multidetector CT imaging of the head was performed following the standard protocol without intravenous contrast. RADIATION DOSE REDUCTION: This exam was performed according to the departmental dose-optimization program which includes automated exposure control, adjustment of the mA and/or kV according to patient size and/or use of iterative reconstruction technique. COMPARISON:  None Available. FINDINGS: CT HEAD FINDINGS Brain: The examination is markedly limited by motion artifact. No large acute intracranial hemorrhage identified. No grossly abnormal mass effect or midline shift. Ventricular size is normal. Vascular: Limited by motion artifact Skull: No definite fracture identified.  Marked motion artifact. Sinuses/Orbits: Visualized paranasal sinuses are clear. Orbits are poorly evaluated due to motion artifact. Other: Mastoid air cells and middle ear cavities are clear. IMPRESSION: 1. Markedly limited examination due to motion artifact. No large acute intracranial hemorrhage identified. No grossly abnormal mass effect or midline shift.  Electronically Signed   By: Helyn Numbers M.D.   On: 08/09/2023 01:04   DG Chest Port 1 View  Result Date: 08/08/2023 CLINICAL DATA:  Altered mental status. EXAM: PORTABLE CHEST 1 VIEW COMPARISON:  07/17/2021 FINDINGS: Lung volumes are low. Normal heart size for technique. Minor retrocardiac atelectasis. No focal airspace disease, large pleural effusion or pneumothorax. No acute osseous abnormalities are seen. IMPRESSION: Low lung volumes with minor retrocardiac atelectasis. Electronically Signed   By: Narda Rutherford M.D.   On: 08/08/2023 23:49    Time coordinating discharge: 35 mins  SIGNED:  Carollee Herter, DO [facility] 08/14/23, 11:55 AM

## 2023-08-14 NOTE — Tx Team (Signed)
Initial Treatment Plan 08/14/2023 7:24 PM [name] ZOX:[id number]    PATIENT STRESSORS: Financial difficulties   Marital or family conflict   Medication change or noncompliance   Substance abuse     PATIENT STRENGTHS: Average or above average intelligence  Capable of independent living  General fund of knowledge  Motivation for treatment/growth    PATIENT IDENTIFIED PROBLEMS: "Suicide thoughts"  "Substance abuse"  "anxiety"  "Depression"               DISCHARGE CRITERIA:  Ability to meet basic life and health needs Adequate post-discharge living arrangements Improved stabilization in mood, thinking, and/or behavior Medical problems require only outpatient monitoring Motivation to continue treatment in a less acute level of care Need for constant or close observation no longer present Reduction of life-threatening or endangering symptoms to within safe limits  PRELIMINARY DISCHARGE PLAN: Attend aftercare/continuing care group Attend PHP/IOP Attend 12-step recovery group Outpatient therapy Return to previous living arrangement  PATIENT/FAMILY INVOLVEMENT: This treatment plan has been presented to and reviewed with the patient, Christina Stokes..  The patient and family have been given the opportunity to ask questions and make suggestions.  Quintella Reichert Webster, RN 08/14/2023, 7:24 PM

## 2023-08-14 NOTE — Plan of Care (Signed)
  Problem: Education: Goal: Knowledge of [facility] Education information/materials will improve 08/14/2023 1932 by Jearl Klinefelter, RN Outcome: Progressing 08/14/2023 1932 by Jearl Klinefelter, RN Outcome: Progressing 08/14/2023 1931 by Jearl Klinefelter, RN Outcome: Progressing Goal: Emotional status will improve 08/14/2023 1932 by Jearl Klinefelter, RN Outcome: Progressing 08/14/2023 1932 by Jearl Klinefelter, RN Outcome: Progressing 08/14/2023 1931 by Jearl Klinefelter, RN Outcome: Progressing Goal: Mental status will improve 08/14/2023 1932 by Jearl Klinefelter, RN Outcome: Progressing 08/14/2023 1932 by Jearl Klinefelter, RN Outcome: Progressing 08/14/2023 1931 by Jearl Klinefelter, RN Outcome: Progressing Goal: Verbalization of understanding the information provided will improve 08/14/2023 1932 by Jearl Klinefelter, RN Outcome: Progressing 08/14/2023 1932 by Jearl Klinefelter, RN Outcome: Progressing 08/14/2023 1931 by Jearl Klinefelter, RN Outcome: Progressing   Problem: Activity: Goal: Interest or engagement in activities will improve 08/14/2023 1932 by Jearl Klinefelter, RN Outcome: Progressing 08/14/2023 1932 by Jearl Klinefelter, RN Outcome: Progressing 08/14/2023 1931 by Jearl Klinefelter, RN Outcome: Progressing Goal: Sleeping patterns will improve 08/14/2023 1932 by Jearl Klinefelter, RN Outcome: Progressing 08/14/2023 1932 by Jearl Klinefelter, RN Outcome: Progressing 08/14/2023 1931 by Jearl Klinefelter, RN Outcome: Progressing   Problem: Coping: Goal: Ability to verbalize frustrations and anger appropriately will improve 08/14/2023 1932 by Jearl Klinefelter, RN Outcome: Progressing 08/14/2023 1932 by Jearl Klinefelter, RN Outcome: Progressing 08/14/2023 1931 by Jearl Klinefelter, RN Outcome: Progressing Goal: Ability to demonstrate self-control will improve 08/14/2023 1932 by Jearl Klinefelter, RN Outcome: Progressing 08/14/2023  1932 by Jearl Klinefelter, RN Outcome: Progressing 08/14/2023 1931 by Jearl Klinefelter, RN Outcome: Progressing   Problem: Health Behavior/Discharge Planning: Goal: Identification of resources available to assist in meeting health care needs will improve 08/14/2023 1932 by Jearl Klinefelter, RN Outcome: Progressing 08/14/2023 1932 by Jearl Klinefelter, RN Outcome: Progressing 08/14/2023 1931 by Jearl Klinefelter, RN Outcome: Progressing Goal: Compliance with treatment plan for underlying cause of condition will improve 08/14/2023 1932 by Jearl Klinefelter, RN Outcome: Progressing 08/14/2023 1932 by Jearl Klinefelter, RN Outcome: Progressing 08/14/2023 1931 by Jearl Klinefelter, RN Outcome: Progressing   Problem: Physical Regulation: Goal: Ability to maintain clinical measurements within normal limits will improve 08/14/2023 1932 by Jearl Klinefelter, RN Outcome: Progressing 08/14/2023 1932 by Jearl Klinefelter, RN Outcome: Progressing 08/14/2023 1931 by Jearl Klinefelter, RN Outcome: Progressing   Problem: Safety: Goal: Periods of time without injury will increase 08/14/2023 1932 by Jearl Klinefelter, RN Outcome: Progressing 08/14/2023 1932 by Jearl Klinefelter, RN Outcome: Progressing 08/14/2023 1931 by Jearl Klinefelter, RN Outcome: Progressing

## 2023-08-14 NOTE — TOC Transition Note (Signed)
Transition of Care [facility]) - CM/SW Discharge Note   Patient Details  Name: Christina Stokes MRN: [id number] Date of Birth: July 01, 1976  Transition of Care [facility]) CM/SW Contact:  Deatra Robinson, LCSW Phone Number: 08/14/2023, 1:49 PM   Clinical Narrative: Pt accepted to [facility], room 304-1 with accepting physician Dr. Sherron Flemings. IVC remains in place (case #[id number]), expires 10/11. RN provided with number for report and [employer] called for transport. SW signing off at dc.   Dellie Burns, MSW, LCSW [phone] (coverage)        Final next level of care: Psychiatric [facility] Barriers to Discharge: No Barriers Identified   Patient Goals and CMS Choice      Discharge Placement                  Patient to be transferred to facility by: [employer]   Patient and family notified of of transfer: 08/14/23  Discharge Plan and Services Additional resources added to the After Visit Summary for                                       Social Determinants of Health (SDOH) Interventions SDOH Screenings   Food Insecurity: No Food Insecurity (12/30/2022)   Received from [facility]  Housing: Patient Unable To Answer (08/09/2023)  Transportation Needs: No Transportation Needs (12/30/2022)   Received from [facility]  Utilities: Not At Risk (12/30/2022)   Received from [facility]  Financial Resource Strain: Low Risk  (12/30/2022)   Received from [facility]  Physical Activity: Insufficiently Active (12/30/2022)   Received from [facility]  Social Connections: Socially Integrated (12/30/2022)   Received from [facility]  Stress: Patient Declined (12/30/2022)   Received from [facility]  Recent Concern: Stress - Stress Concern Present (12/02/2022)   Received from [facility]  Tobacco Use: Medium Risk (08/08/2023)     Readmission Risk Interventions     No data to display

## 2023-08-14 NOTE — Progress Notes (Signed)
PROGRESS NOTE    Christina Stokes  WUJ:[id number] DOB: September 22, 1976 DOA: 08/08/2023 PCP: Vivien Presto, MD  Subjective: Pt seen and examined. Pt able to urinate on her own.  Psych resident came into room while I was examining patient.  Pt is medically stable for discharge.   Hospital Course: HPI:  Christina Stokes is a 47 y.o. female with medical history significant for iron deficiency anemia, chronic anxiety/depression, tobacco use disorder, who presented to the ED via EMS due to suicidal attempt with overdose of prescribed medications.  The patient was found by a bystander having what appeared to be a seizure like activity.  EMS was activated and upon arrival, the patient had a suicidal note on her and an empty bottle of Flexeril.  Was administered Narcan en route with some response.   In the ED, somnolent and protecting her airway.  Response to painful stimuli.  Poison control was notified by pharmacy.  Recommend 72-hour observation due to Wellbutrin and to monitor for seizure activity and urinary retention.  Admitted by [facility], hospitalist service.  Significant Events: Admitted 08/08/2023 for suicide attempt by overdose   Significant Labs: Admitting UDS positive for amphetamines  Significant Imaging Studies: CT c-spine showed Rightward rotation of the atlas in relation to the dens is presumed positional though clinical correlation is recommended to exclude rotary subluxation.  Antibiotic Therapy: Anti-infectives (From admission, onward)    None       Procedures:   Consultants: Psych    Assessment and Plan: * Overdose Pt admitted for overdose on flexeril. Pt seen by psych consult. Pt was very sleepy for the several days of her hospitalization but has since woken up.  Psychiatry wants her to go to inpatient psych.  08-13-2023. C-collar removed this morning at the patient is alert and orient x 3.  No step-offs, pain with palpation over spinous process.  Patient able to gently  flex and extend her neck and rotate her head left and right without any pain.  C-collar removed.  08-13-2023 Pt is medically stable for transfer to inpatient [facility]. 08-14-2023 pt remains medically stable for transfer to [facility]. Psych will renew her IVC. Orders written that pt may shower.  Suicide attempt [name]) Patient seen by psychiatry.  She still is under involuntary commitment orders.  Psychiatry will determine if IVC can be discontinued.  She remains with one-to-one sitter.  She will likely need inpatient behavioral health admission.  Acute urinary retention Likely due to overdose of Flexeril.  She had a Foley catheter placed on admission.  Foley catheter was discontinued on 08/13/2023. Pt able to urinate on her own now. Resolved.  Polysubstance abuse (HCC) Stable.  Patient is UDS positive for amphetamines.   DVT prophylaxis: enoxaparin (LOVENOX) injection 40 mg Start: 08/09/23 1000    Code Status: Full Code Family Communication: no family at bedside Disposition Plan: transfer to inpatient [facility] Reason for continuing need for hospitalization: needs transfer to inpatient [facility]  Objective: Vitals:   08/13/23 1911 08/13/23 2315 08/14/23 0327 08/14/23 0808  BP: 108/66 107/71 106/68 (!) 136/94  Pulse: 94 90 88 86  Resp: 18 16 16 18   Temp: 97.8 F (36.6 C) 98.4 F (36.9 C) 98.1 F (36.7 C) 98.1 F (36.7 C)  TempSrc: Oral Oral Oral Oral  SpO2: 97% 96% 99% 99%  Weight:   70.1 kg   Height:        Intake/Output Summary (Last 24 hours) at 08/14/2023 0935 Last data filed at 08/14/2023 0700 Gross per 24 hour  Intake 720 ml  Output 500 ml  Net 220 ml   Filed Weights   08/09/23 0445 08/12/23 0400 08/14/23 0327  Weight: 74.2 kg 78.6 kg 70.1 kg    Examination:  Physical Exam Vitals and nursing note reviewed.  Constitutional:      General: She is not in acute distress.    Appearance: She is not toxic-appearing or diaphoretic.  HENT:     Head: Normocephalic and atraumatic.   Cardiovascular:     Rate and Rhythm: Normal rate and regular rhythm.  Pulmonary:     Effort: Pulmonary effort is normal.     Breath sounds: Normal breath sounds. No rales.  Abdominal:     General: Bowel sounds are normal.     Palpations: Abdomen is soft.  Musculoskeletal:     Right lower leg: No edema.     Left lower leg: No edema.  Skin:    General: Skin is warm and dry.     Capillary Refill: Capillary refill takes less than 2 seconds.  Neurological:     General: No focal deficit present.     Mental Status: She is alert.    Data Reviewed: I have personally reviewed following labs and imaging studies  CBC: Recent Labs  Lab 08/08/23 2307 08/08/23 2328 08/09/23 0713 08/10/23 0300 08/13/23 0308  WBC 5.2  --  4.7 7.3 4.5  NEUTROABS  --   --   --   --  2.4  HGB 12.4 12.9 11.6* 12.0 14.2  HCT 38.1 38.0 34.4* 36.6 43.7  MCV 91.6  --  90.5 88.8 88.3  PLT 216  --  194 191 237   Basic Metabolic Panel: Recent Labs  Lab 08/08/23 2307 08/08/23 2328 08/09/23 0713 08/09/23 1409 08/10/23 0300 08/13/23 0308  NA 137 140  --  137 135 136  K 2.5* 2.5*  --  3.4* 3.7 3.8  CL 101 100  --  103 101 100  CO2 23  --   --  24 23 27   GLUCOSE 137* 136*  --  87 82 113*  BUN 14 14  --  6 <5* 18  CREATININE 0.72 0.60 0.62 0.64 0.58 0.77  CALCIUM 8.4*  --   --  8.0* 8.4* 9.3  MG 2.0  --   --   --   --  2.1  PHOS  --   --   --   --   --  3.5   GFR: Estimated Creatinine Clearance: 82.5 mL/min (by C-G formula based on SCr of 0.77 mg/dL). Liver Function Tests: Recent Labs  Lab 08/08/23 2307 08/13/23 0308  AST 26 15  ALT 29 18  ALKPHOS 67 58  BILITOT 0.8 0.6  PROT 6.7 6.7  ALBUMIN 3.8 3.1*   CBG: Recent Labs  Lab 08/08/23 2316  GLUCAP 134*   Radiology Studies: No results found.  Scheduled Meds:  Chlorhexidine Gluconate Cloth  6 each Topical Q0600   enoxaparin (LOVENOX) injection  40 mg Subcutaneous Q24H   escitalopram  10 mg Oral Daily   Continuous Infusions:   LOS: 5  days   Time spent: 30 minutes  Carollee Herter, DO  [facility]  08/14/2023, 9:35 AM

## 2023-08-14 NOTE — Progress Notes (Signed)
Patient is 47 yrs old, involuntary, stated she took one bottle of flexeril, completed SI note, received Narcan in hospital.  Stated she had never been patient at [facility].  Bruising on R upper arm, L upper arm, over R breast, R thigh.  Tattoos on R forearm and R wrist.  Married, has two children 58 yrs old and 64 yrs old.  Patient stated her husband is leaving the marriage.  Husband and two girls kicked patient out of home two weeks ago.  Lives on [address], [place], [place]. Has been using methamphetamines for the past two yrs given to her by her cousin.  Rated anxiety 5, depression 3, hopeless 2.  Denied SI during admission, contracts for safety.  Denied HI.  Denied A/V hallucinations.  Denied physical , berbal and sexual abuse.  Has high school diploma.  Has not worked recently.  Smokes one pack cigarettes daily since 47 yrs old.  Goes to [employer], [address]   Fall risk information, no fall risk. Patient has been cooperative and pleasant.

## 2023-08-14 NOTE — Progress Notes (Signed)
Darnelle Bos to be D/C'd to [facility] per MD order. Report called to [facility].  Skin clean and dry with bruising to arms and legs.  IV catheters discontinued intact. An After Visit Summary was printed and given to transport.  Patient escorted with sheriff and D/C to [facility]. Jon Gills  08/14/2023

## 2023-08-15 ENCOUNTER — Encounter (HOSPITAL_COMMUNITY): Payer: Self-pay

## 2023-08-15 DIAGNOSIS — F332 Major depressive disorder, recurrent severe without psychotic features: Secondary | ICD-10-CM

## 2023-08-15 DIAGNOSIS — G43909 Migraine, unspecified, not intractable, without status migrainosus: Secondary | ICD-10-CM | POA: Insufficient documentation

## 2023-08-15 DIAGNOSIS — F17200 Nicotine dependence, unspecified, uncomplicated: Secondary | ICD-10-CM | POA: Diagnosis present

## 2023-08-15 DIAGNOSIS — F1510 Other stimulant abuse, uncomplicated: Secondary | ICD-10-CM | POA: Diagnosis present

## 2023-08-15 LAB — URINALYSIS, ROUTINE W REFLEX MICROSCOPIC
Bilirubin Urine: NEGATIVE
Glucose, UA: NEGATIVE mg/dL
Hgb urine dipstick: NEGATIVE
Ketones, ur: NEGATIVE mg/dL
Nitrite: POSITIVE — AB
Protein, ur: NEGATIVE mg/dL
Specific Gravity, Urine: 1.024 (ref 1.005–1.030)
WBC, UA: >50 WBC/hpf (ref 0–5)
pH: 6 (ref 5.0–8.0)

## 2023-08-15 LAB — TSH: TSH: 1.642 u[IU]/mL (ref 0.350–4.500)

## 2023-08-15 LAB — HEMOGLOBIN A1C
Hgb A1c MFr Bld: 5.8 % — ABNORMAL HIGH (ref 4.8–5.6)
Mean Plasma Glucose: 119.76 mg/dL

## 2023-08-15 MED ORDER — DOCUSATE SODIUM 100 MG PO CAPS
100.0000 mg | ORAL_CAPSULE | Freq: Every day | ORAL | Status: DC
  Administered 2023-08-16 – 2023-08-18 (×3): 100 mg via ORAL
  Filled 2023-08-15 (×6): qty 1

## 2023-08-15 MED ORDER — LISINOPRIL-HYDROCHLOROTHIAZIDE 10-12.5 MG PO TABS: 1.0000 | ORAL_TABLET | Freq: Every day | ORAL | Status: DC

## 2023-08-15 MED ORDER — HYDROCHLOROTHIAZIDE 12.5 MG PO TABS
12.5000 mg | ORAL_TABLET | Freq: Every day | ORAL | Status: DC
  Administered 2023-08-17 – 2023-08-18 (×2): 12.5 mg via ORAL
  Filled 2023-08-15 (×6): qty 1

## 2023-08-15 MED ORDER — LISINOPRIL 10 MG PO TABS
10.0000 mg | ORAL_TABLET | Freq: Every day | ORAL | Status: DC
  Administered 2023-08-17 – 2023-08-18 (×2): 10 mg via ORAL
  Filled 2023-08-15 (×6): qty 1

## 2023-08-15 NOTE — Group Note (Signed)
Date:  08/15/2023 Time:  6:43 PM  Group Topic/Focus:  Self Care:   The focus of this group is to help patients understand the importance of self-care in order to improve or restore emotional, physical, spiritual, interpersonal, and financial health.    Participation Level:  Active  Participation Quality:  Appropriate  Affect:  Appropriate  Cognitive:  Appropriate  Insight: Appropriate  Engagement in Group:  Engaged  Modes of Intervention:  Education  Additional Comments:     Christina Stokes 08/15/2023, 6:43 PM

## 2023-08-15 NOTE — Progress Notes (Signed)
   08/15/23 2229  Psych Admission Type (Psych Patients Only)  Admission Status Involuntary  Psychosocial Assessment  Patient Complaints None  Eye Contact Brief  Facial Expression Anxious  Affect Appropriate to circumstance  Speech Unremarkable  Interaction Assertive  Motor Activity Slow  Appearance/Hygiene Unremarkable  Behavior Characteristics Cooperative  Mood Pleasant  Thought Process  Coherency WDL  Content WDL  Delusions WDL  Perception WDL  Hallucination None reported or observed  Judgment Limited  Confusion WDL  Danger to Self  Current suicidal ideation? Denies  Agreement Not to Harm Self Yes  Description of Agreement verbal  Danger to Others  Danger to Others None reported or observed
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
## 2023-08-15 NOTE — [facility] Group Notes (Signed)
[facility] Group Notes:  (Nursing/MHT/Case Management/Adjunct)  Date:  08/15/2023  Time:  2000  Type of Therapy:   Alcoholics Anonymous Meeting  Participation Level:  Active  Participation Quality:  Appropriate, Attentive, Sharing, and Supportive  Affect:  Appropriate  Cognitive:  Alert  Insight:  Improving  Engagement in Group:  Engaged  Modes of Intervention:  Clarification, Education, and Support  Summary of Progress/Problems:  Marcille Buffy 08/15/2023, 9:43 PM

## 2023-08-15 NOTE — Progress Notes (Signed)

## 2023-08-15 NOTE — Group Note (Signed)
Date:  08/15/2023 Time:  10:02 AM  Group Topic/Focus:  Goals Group:   The focus of this group is to help patients establish daily goals to achieve during treatment and discuss how the patient can incorporate goal setting into their daily lives to aide in recovery.    Participation Level:  Active  Participation Quality:  Appropriate  Affect:  Appropriate  Cognitive:  Appropriate  Insight: Appropriate  Engagement in Group:  Engaged  Modes of Intervention:  Discussion  Additional Comments:     Reymundo Poll 08/15/2023, 10:02 AM
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
## 2023-08-15 NOTE — BH IP Treatment Plan (Signed)
Interdisciplinary Treatment and Diagnostic Plan Update  08/15/2023 Time of Session: 11:20 AM Christina Stokes MRN: [id number]  Principal Diagnosis: Major depressive disorder, recurrent episode with mixed features (HCC)  Secondary Diagnoses: Principal Problem:   Major depressive disorder, recurrent episode with mixed features (HCC) Active Problems:   Tobacco use disorder   Methamphetamine abuse, episodic (HCC)   Severe episode of recurrent major depressive disorder, without psychotic features (HCC)   Current Medications:  Current Facility-Administered Medications  Medication Dose Route Frequency Provider Last Rate Last Admin   acetaminophen (TYLENOL) tablet 650 mg  650 mg Oral Q6H PRN Karie Fetch, MD       alum & mag hydroxide-simeth (MAALOX/MYLANTA) 200-200-20 MG/5ML suspension 30 mL  30 mL Oral Q4H PRN Karie Fetch, MD       diphenhydrAMINE (BENADRYL) capsule 50 mg  50 mg Oral TID PRN Karie Fetch, MD       Or   diphenhydrAMINE (BENADRYL) injection 50 mg  50 mg Intramuscular TID PRN Karie Fetch, MD       docusate sodium (COLACE) capsule 100 mg  100 mg Oral Daily Kizzie Ide B, MD       escitalopram (LEXAPRO) tablet 10 mg  10 mg Oral Daily Karie Fetch, MD   10 mg at 08/15/23 0815   haloperidol (HALDOL) tablet 5 mg  5 mg Oral TID PRN Karie Fetch, MD       Or   haloperidol lactate (HALDOL) injection 5 mg  5 mg Intramuscular TID PRN Karie Fetch, MD       lisinopril (ZESTRIL) tablet 10 mg  10 mg Oral Daily Massengill, Nathan, MD       And   hydrochlorothiazide (HYDRODIURIL) tablet 12.5 mg  12.5 mg Oral Daily Massengill, Nathan, MD       hydrOXYzine (ATARAX) tablet 25 mg  25 mg Oral TID PRN Karie Fetch, MD       LORazepam (ATIVAN) tablet 2 mg  2 mg Oral TID PRN Karie Fetch, MD       Or   LORazepam (ATIVAN) injection 2 mg  2 mg Intramuscular TID PRN Karie Fetch, MD       magnesium hydroxide (MILK OF MAGNESIA) suspension 30 mL  30 mL Oral  Daily PRN Karie Fetch, MD       nicotine (NICODERM CQ - dosed in mg/24 hours) patch 21 mg  21 mg Transdermal Q24H Massengill, Harrold Donath, MD       traZODone (DESYREL) tablet 50 mg  50 mg Oral QHS PRN Karie Fetch, MD   50 mg at 08/14/23 2140   PTA Medications: Medications Prior to Admission  Medication Sig Dispense Refill Last Dose   lisinopril-hydrochlorothiazide (ZESTORETIC) 10-12.5 MG tablet Take 1 tablet by mouth daily.      varenicline (CHANTIX) 1 MG tablet Take 1 mg by mouth 2 (two) times daily.       Patient Stressors: Financial difficulties   Marital or family conflict   Medication change or noncompliance   Substance abuse    Patient Strengths: Average or above average intelligence  Capable of independent living  General fund of knowledge  Motivation for treatment/growth   Treatment Modalities: Medication Management, Group therapy, Case management,  1 to 1 session with clinician, Psychoeducation, Recreational therapy.   Physician Treatment Plan for Primary Diagnosis: Major depressive disorder, recurrent episode with mixed features (HCC) Long Term Goal(s):     Short Term Goals: Ability to identify changes in lifestyle to reduce recurrence of condition will improve Ability to verbalize  feelings will improve Ability to disclose and discuss suicidal ideas Ability to demonstrate self-control will improve Ability to identify and develop effective coping behaviors will improve Ability to maintain clinical measurements within normal limits will improve Compliance with prescribed medications will improve Ability to identify triggers associated with substance abuse/mental health issues will improve  Medication Management: Evaluate patient's response, side effects, and tolerance of medication regimen.  Therapeutic Interventions: 1 to 1 sessions, Unit Group sessions and Medication administration.  Evaluation of Outcomes: Not Progressing  Physician Treatment Plan for  Secondary Diagnosis: Principal Problem:   Major depressive disorder, recurrent episode with mixed features (HCC) Active Problems:   Tobacco use disorder   Methamphetamine abuse, episodic (HCC)   Severe episode of recurrent major depressive disorder, without psychotic features (HCC)  Long Term Goal(s):     Short Term Goals: Ability to identify changes in lifestyle to reduce recurrence of condition will improve Ability to verbalize feelings will improve Ability to disclose and discuss suicidal ideas Ability to demonstrate self-control will improve Ability to identify and develop effective coping behaviors will improve Ability to maintain clinical measurements within normal limits will improve Compliance with prescribed medications will improve Ability to identify triggers associated with substance abuse/mental health issues will improve     Medication Management: Evaluate patient's response, side effects, and tolerance of medication regimen.  Therapeutic Interventions: 1 to 1 sessions, Unit Group sessions and Medication administration.  Evaluation of Outcomes: Not Progressing   RN Treatment Plan for Primary Diagnosis: Major depressive disorder, recurrent episode with mixed features (HCC) Long Term Goal(s): Knowledge of disease and therapeutic regimen to maintain health will improve  Short Term Goals: Ability to remain free from injury will improve, Ability to verbalize frustration and anger appropriately will improve, Ability to demonstrate self-control, Ability to participate in decision making will improve, Ability to verbalize feelings will improve, Ability to disclose and discuss suicidal ideas, Ability to identify and develop effective coping behaviors will improve, and Compliance with prescribed medications will improve  Medication Management: RN will administer medications as ordered by provider, will assess and evaluate patient's response and provide education to patient for  prescribed medication. RN will report any adverse and/or side effects to prescribing provider.  Therapeutic Interventions: 1 on 1 counseling sessions, Psychoeducation, Medication administration, Evaluate responses to treatment, Monitor vital signs and CBGs as ordered, Perform/monitor CIWA, COWS, AIMS and Fall Risk screenings as ordered, Perform wound care treatments as ordered.  Evaluation of Outcomes: Not Progressing   LCSW Treatment Plan for Primary Diagnosis: Major depressive disorder, recurrent episode with mixed features (HCC) Long Term Goal(s): Safe transition to appropriate next level of care at discharge, Engage patient in therapeutic group addressing interpersonal concerns.  Short Term Goals: Engage patient in aftercare planning with referrals and resources, Increase social support, Increase ability to appropriately verbalize feelings, Increase emotional regulation, Facilitate acceptance of mental health diagnosis and concerns, Facilitate patient progression through stages of change regarding substance use diagnoses and concerns, Identify triggers associated with mental health/substance abuse issues, and Increase skills for wellness and recovery  Therapeutic Interventions: Assess for all discharge needs, 1 to 1 time with Social worker, Explore available resources and support systems, Assess for adequacy in community support network, Educate family and significant other(s) on suicide prevention, Complete Psychosocial Assessment, Interpersonal group therapy.  Evaluation of Outcomes: Not Progressing   Progress in Treatment: Attending groups: Yes. Participating in groups: Yes. Taking medication as prescribed: Yes. Toleration medication: Yes. Family/Significant other contact made: No, will contact:  Will  contact whoever pt gives CSW permission to contact  Patient understands diagnosis: Yes. Discussing patient identified problems/goals with staff: Yes. Medical problems stabilized or  resolved: Yes. Denies suicidal/homicidal ideation: Yes. Issues/concerns per patient self-inventory: No.   New problem(s) identified: No, Describe:  None reported   New Short Term/Long Term Goal(s):   medication stabilization, elimination of SI thoughts, development of comprehensive mental wellness plan.    Patient Goals:  " go to a treatment center and get back to myself "   Discharge Plan or Barriers: Patient recently admitted. CSW will continue to follow and assess for appropriate referrals and possible discharge planning.    Reason for Continuation of Hospitalization: Anxiety Depression Medication stabilization Suicidal ideation  Estimated Length of Stay: 5-7 days   Last 3 [place] Suicide Severity Risk Score: Flowsheet Row [facility] (Current[facility] 12[facility]18/2021 in [facility] Department at [facility] ED from 07/18/2021 in [facility] Emergency Department at [facility]  C-SSRS RISK CATEGORY High Risk No Risk No Risk       Last PHQ 2/9 Scores:     No data to display          Scribe for Treatment Team: Beather Arbour 08/15/2023 1:43 PM

## 2023-08-15 NOTE — [facility] Suicide Risk Assessment (Signed)
[facility] INPATIENT:  Family/Significant Other Suicide Prevention Education  Suicide Prevention Education:  Education Completed; 08-15-2023, Bartolo Darter (Daughter) has been identified by the patient as the family member/significant other with whom the patient will be residing, and identified as the person(s) who will aid the patient in the event of a mental health crisis (suicidal ideations/suicide attempt).  With written consent from the patient, the family member/significant other has been provided the following suicide prevention education, prior to the and/or following the discharge of the patient.  The suicide prevention education provided includes the following: Suicide risk factors Suicide prevention and interventions National Suicide Hotline telephone number [facility] assessment telephone number [facility] Emergency Assistance 911 [facility] and/or Residential Mobile Crisis Unit telephone number  Request made of family/significant other to: Remove weapons (e.g., guns, rifles, knives), all items previously/currently identified as safety concern.   Remove drugs/medications (over-the-counter, prescriptions, illicit drugs), all items previously/currently identified as a safety concern.  Christina Stokes  verbalizes understanding of the suicide prevention education information provided.  The family member/significant other agrees to remove the items of safety concern listed above.  Christina Stokes 08/15/2023, 2:23 PM
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
## 2023-08-16 DIAGNOSIS — F332 Major depressive disorder, recurrent severe without psychotic features: Secondary | ICD-10-CM | POA: Diagnosis not present

## 2023-08-16 LAB — LIPID PANEL
Cholesterol: 162 mg/dL (ref 0–200)
HDL: 38 mg/dL — ABNORMAL LOW (ref >40)
LDL Cholesterol: 97 mg/dL (ref 0–99)
Total CHOL/HDL Ratio: 4.3 {ratio}
Triglycerides: 135 mg/dL (ref <150)
VLDL: 27 mg/dL (ref 0–40)

## 2023-08-16 MED ORDER — POLYETHYLENE GLYCOL 3350 17 G PO PACK
17.0000 g | PACK | Freq: Every day | ORAL | Status: DC | PRN
  Administered 2023-08-16 – 2023-08-17 (×2): 17 g via ORAL
  Filled 2023-08-16 (×2): qty 1

## 2023-08-16 MED ORDER — ARIPIPRAZOLE 5 MG PO TABS
5.0000 mg | ORAL_TABLET | Freq: Every day | ORAL | Status: DC
  Administered 2023-08-16 – 2023-08-18 (×3): 5 mg via ORAL
  Filled 2023-08-16 (×6): qty 1

## 2023-08-16 MED ORDER — NITROFURANTOIN MONOHYD MACRO 100 MG PO CAPS
100.0000 mg | ORAL_CAPSULE | Freq: Two times a day (BID) | ORAL | Status: DC
  Administered 2023-08-16 – 2023-08-18 (×5): 100 mg via ORAL
  Filled 2023-08-16 (×10): qty 1

## 2023-08-16 NOTE — Group Note (Signed)
Date:  08/16/2023 Time:  11:23 PM  Group Topic/Focus:  Wrap-Up Group:   The focus of this group is to help patients review their daily goal of treatment and discuss progress on daily workbooks.    Participation Level:  Active  Participation Quality:  Appropriate and Sharing  Affect:  Appropriate  Cognitive:  Appropriate  Insight: Appropriate  Engagement in Group:  Engaged  Modes of Intervention:  Socialization  Additional Comments:  The patient stated that she had a "good day". The patient shared that she had a visit from her husband and that made her day even better. The patient stated that her goal is to speak with social workers/treatment teams for resources for rehab. The patient rated her day a 8/10. The patient participated in the group activity.   Kennieth Francois 08/16/2023, 11:23 PM

## 2023-08-16 NOTE — Plan of Care (Signed)
  Problem: Education: Goal: Knowledge of [facility] Education information/materials will improve Outcome: Progressing Goal: Emotional status will improve Outcome: Progressing Goal: Mental status will improve Outcome: Progressing Goal: Verbalization of understanding the information provided will improve Outcome: Progressing   

## 2023-08-16 NOTE — Progress Notes (Addendum)
[facility] MD Progress Note  08/16/2023 8:40 AM Christina Stokes  MRN:  [id number]  Principal Problem: Major depressive disorder, recurrent episode with mixed features (HCC) Diagnosis: Principal Problem:   Major depressive disorder, recurrent episode with mixed features (HCC) Active Problems:   Tobacco use disorder   Methamphetamine abuse, episodic (HCC)   Severe episode of recurrent major depressive disorder, without psychotic features (HCC)  Reason for Admission:  Christina Stokes is a 47 y.o., female with a past psychiatric history significant for GAD, MDD, tobacco use disorder, opiate use disorder in remission, stimulant use disorder (methamphetamine) who presents to the [facility] Involuntary from [facility] Emergency Department for evaluation and management of SI with overdosing on flexeril.   (admitted on 08/14/2023, total  LOS: 2 days )  Yesterday, the psychiatry team made following recommendations:  -Start Lexapro 10 mg for depression (was given in [facility]) -Trazodone 50 mg at bedtime as needed for insomnia  Chart review: Overnight Events HR 103, otherwise VSS. MAR was reviewed and patient was compliant with psychotropic medications, non-compliant with colace, hydrochlorothiazide-lisinopril.  Patient received PRN atarax, milk of mg, and trazodone. Patient attended 2 meetings. UA+large leukocytes, positive nitrite. Slept 6.25 hours.   Pertinent information discussed during bed progression:  Case was discussed in the multidisciplinary team. Per nursing, patient is calm and cooperative but was not feeling well this morning due to constipation, UTI.    Information Obtained Today During Patient Interview: Patient evaluated at bedside. Reports sleep no issues. Reports appetite good last night but poor this AM due to wanting to have a BM. States mood is "pretty good" today. Today patient reports improvements in her mood . Side effects to currently prescribed medications are none.  Reports constipation, amenable to starting colace, PRN miralax . Reports physical complaints are dysuria.    On interview, suicidal ideations are not present . Homicidal ideations are not present.  There are no auditory hallucinations, visual hallucinations, paranoid ideations, or delusional thought processes.   Reports goals for today include trying to get better.   Past Psychiatric History:  Previous Psych Diagnoses: reported bipolar disorder, GAD, tobacco use disorder, stimulant use disorder Prior psychiatric treatment: unsure of medications but per chart review: seroquel, geodon, wellbutrin, naltrexone, abilify, depakote Psychiatric medication compliance history: non-compliant   Current psychiatric treatment: chantix and xanax (states she had 14 day supply and took 3 for anxiety) Current psychiatrist: has not seen in a while Current therapist: denies   Previous hospitalizations Denies History of suicide attempts: Denies History of self harm: Denies  Family Psychiatric History:  Reports mom had anxiety and OCD Sister has bipolar disorder, anxiety  Social History:  Marital Status: Split with husband in September Children: 5 total kids, two live with husband and 3 are grown Employment: Denies Education: finished Careers information officer Housing: Unhoused in [employer] operational [employer] since split, sometimes staying with husband Legal: Denies Hotel manager: Denies Weapons: Denies Pills stockpile: Yes, daughter helping with this issue  Past Medical History:  Past Medical History:  Diagnosis Date   Anxiety    Back pain    GERD (gastroesophageal reflux disease)    MDD (major depressive disorder)    Methamphetamine abuse (HCC)    Opiate addiction (HCC)    Opioid dependence on agonist therapy (HCC)    Tobacco abuse    Family History: History reviewed. No pertinent family history.  Current Medications: Current Facility-Administered Medications  Medication Dose Route Frequency Provider Last Rate Last Admin    acetaminophen (TYLENOL) tablet 650 mg  650 mg  Oral Q6H PRN Karie Fetch, MD       alum & mag hydroxide-simeth (MAALOX/MYLANTA) 200-200-20 MG/5ML suspension 30 mL  30 mL Oral Q4H PRN Karie Fetch, MD       diphenhydrAMINE (BENADRYL) capsule 50 mg  50 mg Oral TID PRN Karie Fetch, MD       Or   diphenhydrAMINE (BENADRYL) injection 50 mg  50 mg Intramuscular TID PRN Karie Fetch, MD       docusate sodium (COLACE) capsule 100 mg  100 mg Oral Daily Kizzie Ide B, MD       escitalopram (LEXAPRO) tablet 10 mg  10 mg Oral Daily Karie Fetch, MD   10 mg at 08/15/23 0815   haloperidol (HALDOL) tablet 5 mg  5 mg Oral TID PRN Karie Fetch, MD       Or   haloperidol lactate (HALDOL) injection 5 mg  5 mg Intramuscular TID PRN Karie Fetch, MD       lisinopril (ZESTRIL) tablet 10 mg  10 mg Oral Daily Massengill, Nathan, MD       And   hydrochlorothiazide (HYDRODIURIL) tablet 12.5 mg  12.5 mg Oral Daily Massengill, Harrold Donath, MD       hydrOXYzine (ATARAX) tablet 25 mg  25 mg Oral TID PRN Karie Fetch, MD   25 mg at 08/15/23 2122   LORazepam (ATIVAN) tablet 2 mg  2 mg Oral TID PRN Karie Fetch, MD       Or   LORazepam (ATIVAN) injection 2 mg  2 mg Intramuscular TID PRN Karie Fetch, MD       nicotine (NICODERM CQ - dosed in mg/24 hours) patch 21 mg  21 mg Transdermal Q24H Massengill, Harrold Donath, MD       nitrofurantoin (macrocrystal-monohydrate) (MACROBID) capsule 100 mg  100 mg Oral Q12H Karie Fetch, MD       polyethylene glycol (MIRALAX / GLYCOLAX) packet 17 g  17 g Oral Daily PRN Karie Fetch, MD       traZODone (DESYREL) tablet 50 mg  50 mg Oral QHS PRN Karie Fetch, MD   50 mg at 08/15/23 2122    Lab Results:  Results for orders placed or performed during the hospital encounter of 08/14/23 (from the past 48 hour(s))  Urinalysis, Routine w reflex microscopic -Urine, Clean Catch     Status: Abnormal   Collection Time: 08/15/23  1:05 PM  Result Value  Ref Range   Color, Urine YELLOW (A) YELLOW   APPearance TURBID (A) CLEAR   Specific Gravity, Urine 1.024 1.005 - 1.030   pH 6.0 5.0 - 8.0   Glucose, UA NEGATIVE NEGATIVE mg/dL   Hgb urine dipstick NEGATIVE NEGATIVE   Bilirubin Urine NEGATIVE NEGATIVE   Ketones, ur NEGATIVE NEGATIVE mg/dL   Protein, ur NEGATIVE NEGATIVE mg/dL   Nitrite POSITIVE (A) NEGATIVE   Leukocytes,Ua LARGE (A) NEGATIVE   RBC / HPF 0-5 0 - 5 RBC/hpf   WBC, UA >50 0 - 5 WBC/hpf   Bacteria, UA FEW (A) NONE SEEN   Squamous Epithelial / HPF 0-5 0 - 5 /HPF   WBC Clumps PRESENT    Mucus PRESENT    Budding Yeast PRESENT     Comment: Performed at [facility], [place], [address] [place], [place] [address]  TSH     Status: None   Collection Time: 08/15/23  6:22 PM  Result Value Ref Range   TSH 1.642 0.350 - 4.500 uIU/mL    Comment: Performed by a 3rd Generation  assay with a functional sensitivity of <=0.01 uIU/mL. Performed at [facility], [address], [place], [place] [address]   Hemoglobin A1c     Status: Abnormal   Collection Time: 08/15/23  6:22 PM  Result Value Ref Range   Hgb A1c MFr Bld 5.8 (H) 4.8 - 5.6 %    Comment: (NOTE) Pre diabetes:          5.7%-6.4%  Diabetes:              >6.4%  Glycemic control for   <7.0% adults with diabetes    Mean Plasma Glucose 119.76 mg/dL    Comment: Performed at [facility], [address] [place], [place] [address]  Lipid panel     Status: Abnormal   Collection Time: 08/16/23  6:39 AM  Result Value Ref Range   Cholesterol 162 0 - 200 mg/dL   Triglycerides 098 <119 mg/dL   HDL 38 (L) >14 mg/dL   Total CHOL/HDL Ratio 4.3 RATIO   VLDL 27 0 - 40 mg/dL   LDL Cholesterol 97 0 - 99 mg/dL    Comment:        Total Cholesterol/HDL:CHD Risk Coronary Heart Disease Risk Table                     Men   Women  1/2 Average Risk   3.4   3.3  Average Risk       5.0   4.4  2 X Average Risk   9.6   7.1  3 X Average Risk  23.4    11.0        Use the calculated Patient Ratio above and the CHD Risk Table to determine the patient's CHD Risk.        ATP III CLASSIFICATION (LDL):  <100     mg/dL   Optimal  782-956  mg/dL   Near or Above                    Optimal  130-159  mg/dL   Borderline  213-086  mg/dL   High  >578     mg/dL   Very High Performed at [facility], [address] [place], [place] [address]     Blood Alcohol level:  Lab Results  Component Value Date   ETH <10 08/08/2023   [facility]  08/25/2010    <5        LOWEST DETECTABLE LIMIT FOR SERUM ALCOHOL IS 5 mg/dL FOR MEDICAL PURPOSES ONLY    Metabolic Labs: Lab Results  Component Value Date   HGBA1C 5.8 (H) 08/15/2023   MPG 119.76 08/15/2023   No results found for: "PROLACTIN" Lab Results  Component Value Date   CHOL 162 08/16/2023   TRIG 135 08/16/2023   HDL 38 (L) 08/16/2023   CHOLHDL 4.3 08/16/2023   VLDL 27 08/16/2023   LDLCALC 97 08/16/2023    Sleep:Sleep: Good   Physical Findings: AIMS: No  CIWA:    COWS:     Psychiatric Specialty Exam:  Presentation  General Appearance: Appropriate for Environment  Eye Contact:Fair  Speech:Clear and Coherent  Speech Volume:Normal  Handedness:-- (not assessed)   Mood and Affect  Mood:Euthymic  Affect:Appropriate   Thought Process  Thought Processes:Goal Directed  Descriptions of Associations:Intact  Orientation:Full (Time, Place and Person)  Thought Content:Logical  History of Schizophrenia/Schizoaffective disorder: None Duration of Psychotic Symptoms: None Hallucinations:Hallucinations: None  Ideas of Reference:None  Suicidal  Thoughts:Suicidal Thoughts: No  Homicidal Thoughts:Homicidal Thoughts: No   Sensorium  Memory:Immediate Fair; Recent Fair; Remote Fair  Judgment:Intact  Insight:Present   Executive Functions  Concentration:Fair  Attention Span:Fair  Recall:Fair  Fund of  Knowledge:Fair  Language:Fair   Psychomotor Activity  Psychomotor Activity:Psychomotor Activity: Normal   Assets  Assets:Communication Skills; Resilience   Sleep  Sleep:Sleep: Good   Physical Exam ROS Physical Exam Constitutional:      Appearance: the patient is not toxic-appearing.  Pulmonary:     Effort: Pulmonary effort is normal.  Neurological:     General: No focal deficit present.     Mental Status: the patient is alert and oriented to person, place, and time.   Review of Systems  Respiratory:  Negative for shortness of breath.   Cardiovascular:  Negative for chest pain.  Gastrointestinal:  Negative for abdominal pain, diarrhea, nausea and vomiting. Positive for constipation. Positive for dysuria.  Neurological:  Negative for headaches.   Blood pressure 111/82, pulse (!) 103, temperature 98.1 F (36.7 C), temperature source Oral, resp. rate 16, height 5\' 3"  (1.6 m), weight 69.9 kg, SpO2 99%. Body mass index is 27.28 kg/m.  Treatment Plan Summary: Daily contact with patient to assess and evaluate symptoms and progress in treatment, Medication management, and Plan    ASSESSMENT: Christina Stokes is a 47 y.o., female with a past psychiatric history significant for GAD, MDD, tobacco use disorder, opiate use disorder in remission, stimulant use disorder (methamphetamine) who presents to the [facility] Involuntary from [facility] Emergency Department for evaluation and management of SI with overdosing on flexeril.   Diagnoses / Active Problems: MDD, recurrent, severe Stimulant use disorder (methamphetamine) Tobacco use disorder R/o cluster B personality traits  PLAN: Safety and Monitoring:  --  INVOLUNTARY admission to inpatient psychiatric unit for safety, stabilization and treatment  -- Daily contact with patient to assess and evaluate symptoms and progress in treatment  -- Patient's case to be discussed in multi-disciplinary team meeting  --  Observation Level : q15 minute checks  -- Vital signs:  q12 hours  -- Precautions: suicide, elopement, and assault  2. Psychiatric Diagnoses and Treatment:   -continue lexapro 10mg  daily for depressed mood  -start abilify 5mg  daily for antidepressant augmentation   -- The risks/benefits/side-effects/alternatives to this medication were discussed in detail with the patient and time was given for questions. The patient consents to medication trial.              -- Metabolic profile and EKG monitoring obtained while on an atypical antipsychotic  BMI: 27.28 TSH:  Lab Results  Component Value Date   TSH 1.642 08/15/2023   Lipid Panel: pending HbgA1c:  Lab Results  Component Value Date   HGBA1C 5.8 (H) 08/15/2023   QTc: 462 on 10/5              -- Encouraged patient to participate in unit milieu and in scheduled group therapies   -- Short Term Goals: Ability to identify changes in lifestyle to reduce recurrence of condition will improve, Ability to verbalize feelings will improve, Ability to disclose and discuss suicidal ideas, Ability to demonstrate self-control will improve, Ability to identify and develop effective coping behaviors will improve, Ability to maintain clinical measurements within normal limits will improve, Compliance with prescribed medications will improve, and Ability to identify triggers associated with substance abuse/mental health issues will improve   -- Long Term Goals: Improvement in symptoms so as ready for discharge   Other  PRNS:              -- start acetaminophen 650 mg every 6 hours as needed for mild to moderate pain, fever, and headaches              -- start hydroxyzine 25 mg three times a day as needed for anxiety              -- start aluminum-magnesium hydroxide + simethicone 30 mL every 4 hours as needed for heartburn or indigestion              -- start trazodone 50 mg at bedtime as needed for insomnia  -- As needed agitation protocol in-place   3.  Medical Issues Being Addressed:   #Tobacco Use Disorder  Nicotine patch 21mg /24 hours ordered Smoking cessation encouraged  #Constipation -Continue Colace 100 mg daily -Miralax as needed for constipation   #HTN -Continue home lisinopril-hydrochlorothiazide 10-12.5 mg daily  #UTI UA+leukocytes, nitrites -start macrobid 100mg  Q12H x5 days (10/12-10/16)  4. Discharge Planning:   -- Social work and case management to assist with discharge planning and identification of hospital follow-up needs prior to discharge  -- Estimated LOS: 5-7 days  -- Discharge Concerns: Need to establish a safety plan; Medication compliance and effectiveness  -- Discharge Goals: Return home with outpatient referrals for mental health follow-up including medication management/psychotherapy   I certify that inpatient services furnished can reasonably be expected to improve the patient's condition.   This note was created using a voice recognition software as a result there may be grammatical errors inadvertently enclosed that do not reflect the nature of this encounter. Every attempt is made to correct such errors.   Dr. Karie Fetch, MD PGY-2, Psychiatry Residency  10/12/20248:40 AM

## 2023-08-16 NOTE — Progress Notes (Signed)
Georgian, is in bed resting. She has been diagnosed with UTI and doesn't feel well. Taking Macrodantin po and is afebrile.B/P 110/68 Denies SI/HI/AVH Voices no other complaints

## 2023-08-17 DIAGNOSIS — N390 Urinary tract infection, site not specified: Secondary | ICD-10-CM | POA: Diagnosis present

## 2023-08-17 DIAGNOSIS — F332 Major depressive disorder, recurrent severe without psychotic features: Secondary | ICD-10-CM | POA: Diagnosis not present

## 2023-08-17 DIAGNOSIS — K5900 Constipation, unspecified: Secondary | ICD-10-CM | POA: Insufficient documentation

## 2023-08-17 MED ORDER — MAGNESIUM CITRATE PO SOLN
1.0000 | Freq: Once | ORAL | Status: AC
  Administered 2023-08-17: 1 via ORAL
  Filled 2023-08-17 (×2): qty 296

## 2023-08-17 NOTE — Plan of Care (Signed)
  Problem: Coping: Goal: Ability to verbalize frustrations and anger appropriately will improve Outcome: Progressing Goal: Ability to demonstrate self-control will improve Outcome: Progressing   Problem: Activity: Goal: Interest or engagement in activities will improve Outcome: Progressing

## 2023-08-17 NOTE — Progress Notes (Addendum)
   08/17/23 1200  Psych Admission Type (Psych Patients Only)  Admission Status Involuntary  Psychosocial Assessment  Patient Complaints None  Eye Contact Fair  Facial Expression Animated;Anxious  Affect Appropriate to circumstance  Speech Logical/coherent  Interaction Assertive  Motor Activity Slow  Appearance/Hygiene Unremarkable  Behavior Characteristics Cooperative;Appropriate to situation  Mood Pleasant;Anxious  Thought Process  Coherency WDL  Content WDL  Delusions WDL  Perception WDL  Hallucination None reported or observed  Judgment Limited  Confusion None  Danger to Self  Current suicidal ideation? Denies  Agreement Not to Harm Self Yes  Description of Agreement agreed to contact staff before acting on harmful thoughts  Danger to Others  Danger to Others None reported or observed

## 2023-08-17 NOTE — Progress Notes (Signed)
   08/17/23 0001  Psych Admission Type (Psych Patients Only)  Admission Status Involuntary  Psychosocial Assessment  Patient Complaints None  Eye Contact Brief  Facial Expression Anxious  Affect Appropriate to circumstance  Speech Unremarkable  Interaction Assertive  Motor Activity Slow  Appearance/Hygiene Unremarkable  Behavior Characteristics Cooperative  Mood Pleasant  Thought Process  Coherency WDL  Content WDL  Delusions WDL  Perception WDL  Hallucination None reported or observed  Judgment Limited  Confusion WDL  Danger to Self  Current suicidal ideation? Denies  Agreement Not to Harm Self Yes  Description of Agreement verbal  Danger to Others  Danger to Others None reported or observed

## 2023-08-17 NOTE — Plan of Care (Signed)
  Problem: Education: Goal: Knowledge of [facility] Education information/materials will improve Outcome: Progressing Goal: Emotional status will improve Outcome: Progressing Goal: Mental status will improve Outcome: Progressing Goal: Verbalization of understanding the information provided will improve Outcome: Progressing   

## 2023-08-17 NOTE — Progress Notes (Signed)
[facility] MD Progress Note  08/17/2023 2:15 PM Christina Stokes  MRN:  [id number] Subjective:  Christina Stokes was seen today in her room. She is feeling "okay" overall. She took the mag citrate with results still pending. Other than constipation, no new physical complaints. She is sleeping and eating okay. No hallucinations, thoughts of harm to self or others.   Principal Problem: Major depressive disorder, recurrent episode with mixed features (HCC) Diagnosis: Principal Problem:   Major depressive disorder, recurrent episode with mixed features (HCC) Active Problems:   Tobacco use disorder   Methamphetamine abuse, episodic (HCC)   Severe episode of recurrent major depressive disorder, without psychotic features (HCC)   UTI (urinary tract infection)   Constipation  Total Time spent with patient: 15 minutes  Past Psychiatric History:  Previous Psych Diagnoses: reported bipolar disorder, GAD, tobacco use disorder, stimulant use disorder Prior psychiatric treatment: unsure of medications but per chart review: seroquel, geodon, wellbutrin, naltrexone, abilify, depakote Psychiatric medication compliance history: non-compliant   Current psychiatric treatment: chantix and xanax (states she had 14 day supply and took 3 for anxiety) Current psychiatrist: has not seen in a while Current therapist: denies   Previous hospitalizations Denies History of suicide attempts: Denies History of self harm: Denies  Past Medical History:  Past Medical History:  Diagnosis Date   Anxiety    Back pain    GERD (gastroesophageal reflux disease)    MDD (major depressive disorder)    Methamphetamine abuse (HCC)    Opiate addiction (HCC)    Opioid dependence on agonist therapy (HCC)    Tobacco abuse     Past Surgical History:  Procedure Laterality Date   LEG SURGERY     TUBAL LIGATION     Family History: History reviewed. No pertinent family history. Family Psychiatric  History: Reports mom had anxiety and  OCD Sister has bipolar disorder, anxiety Social History:  Social History   Substance and Sexual Activity  Alcohol Use Yes   Comment: occ     Social History   Substance and Sexual Activity  Drug Use Yes   Types: Methamphetamines    Social History   Socioeconomic History   Marital status: Married    Spouse name: Not on file   Number of children: Not on file   Years of education: Not on file   Highest education level: Not on file  Occupational History   Not on file  Tobacco Use   Smoking status: Former    Current packs/day: 0.00    Types: Cigarettes    Quit date: 07/10/2021    Years since quitting: 2.1   Smokeless tobacco: Never  Vaping Use   Vaping status: Never Used  Substance and Sexual Activity   Alcohol use: Yes    Comment: occ   Drug use: Yes    Types: Methamphetamines   Sexual activity: Yes    Birth control/protection: Surgical  Other Topics Concern   Not on file  Social History Narrative   Not on file   Social Determinants of Health   Financial Resource Strain: Low Risk  (12/30/2022)   Received from [facility]   Overall Financial Resource Strain (CARDIA)    Difficulty of Paying Living Expenses: Not hard at all  Food Insecurity: No Food Insecurity (08/14/2023)   Hunger Vital Sign    Worried About Running Out of Food in the Last Year: Never true    Ran Out of Food in the Last Year: Never true  Transportation Needs: Unmet Transportation Needs (  08/14/2023)   PRAPARE - Transportation    Lack of Transportation (Medical): Yes    Lack of Transportation (Non-Medical): Yes  Physical Activity: Insufficiently Active (12/30/2022)   Received from [facility]   Exercise Vital Sign    Days of Exercise per Week: 2 days    Minutes of Exercise per Session: 30 min  Stress: Patient Declined (12/30/2022)   Received from [facility] - Occupational Stress Questionnaire    Feeling of Stress : Patient declined  Recent  Concern: Stress - Stress Concern Present (12/02/2022)   Received from [facility] Occupational [facility] - Occupational Stress Questionnaire    Feeling of Stress : Very much  Social Connections: Socially Integrated (12/30/2022)   Received from [facility]   Social Network    How would you rate your social network (family, work, friends)?: Good participation with social networks   Additional Social History:     Marital Status: Split with husband in September Children: 5 total kids, two live with husband and 3 are grown Employment: Denies Education: finished Careers information officer Housing: Unhoused in [employer] forces operational [employer] since split, sometimes staying with husband Legal: Denies Hotel manager: Denies Weapons: Denies Pills stockpile: Yes, daughter helping with this issue  Sleep: Fair  Appetite:  Fair  Current Medications: Current Facility-Administered Medications  Medication Dose Route Frequency Provider Last Rate Last Admin   acetaminophen (TYLENOL) tablet 650 mg  650 mg Oral Q6H PRN Karie Fetch, MD       alum & mag hydroxide-simeth (MAALOX/MYLANTA) 200-200-20 MG/5ML suspension 30 mL  30 mL Oral Q4H PRN Karie Fetch, MD   30 mL at 08/16/23 1724   ARIPiprazole (ABILIFY) tablet 5 mg  5 mg Oral Daily Karie Fetch, MD   5 mg at 08/17/23 0840   diphenhydrAMINE (BENADRYL) capsule 50 mg  50 mg Oral TID PRN Karie Fetch, MD       Or   diphenhydrAMINE (BENADRYL) injection 50 mg  50 mg Intramuscular TID PRN Karie Fetch, MD       docusate sodium (COLACE) capsule 100 mg  100 mg Oral Daily Kizzie Ide B, MD   100 mg at 08/17/23 0840   escitalopram (LEXAPRO) tablet 10 mg  10 mg Oral Daily Karie Fetch, MD   10 mg at 08/17/23 0840   haloperidol (HALDOL) tablet 5 mg  5 mg Oral TID PRN Karie Fetch, MD       Or   haloperidol lactate (HALDOL) injection 5 mg  5 mg Intramuscular TID PRN Karie Fetch, MD       lisinopril (ZESTRIL) tablet 10 mg  10 mg Oral Daily  Massengill, Harrold Donath, MD   10 mg at 08/17/23 0840   And   hydrochlorothiazide (HYDRODIURIL) tablet 12.5 mg  12.5 mg Oral Daily Massengill, Harrold Donath, MD   12.5 mg at 08/17/23 0840   hydrOXYzine (ATARAX) tablet 25 mg  25 mg Oral TID PRN Karie Fetch, MD   25 mg at 08/16/23 2117   LORazepam (ATIVAN) tablet 2 mg  2 mg Oral TID PRN Karie Fetch, MD       Or   LORazepam (ATIVAN) injection 2 mg  2 mg Intramuscular TID PRN Karie Fetch, MD       nicotine (NICODERM CQ - dosed in mg/24 hours) patch 21 mg  21 mg Transdermal Q24H Massengill, Nathan, MD       nitrofurantoin (macrocrystal-monohydrate) (MACROBID) capsule 100 mg  100 mg Oral Q12H Karie Fetch, MD  100 mg at 08/17/23 0840   polyethylene glycol (MIRALAX / GLYCOLAX) packet 17 g  17 g Oral Daily PRN Karie Fetch, MD   17 g at 08/17/23 0839   traZODone (DESYREL) tablet 50 mg  50 mg Oral QHS PRN Karie Fetch, MD   50 mg at 08/16/23 2117    Lab Results:  Results for orders placed or performed during the hospital encounter of 08/14/23 (from the past 48 hour(s))  TSH     Status: None   Collection Time: 08/15/23  6:22 PM  Result Value Ref Range   TSH 1.642 0.350 - 4.500 uIU/mL    Comment: Performed by a 3rd Generation assay with a functional sensitivity of <=0.01 uIU/mL. Performed at [facility], [address] [place], [place] [address]   Hemoglobin A1c     Status: Abnormal   Collection Time: 08/15/23  6:22 PM  Result Value Ref Range   Hgb A1c MFr Bld 5.8 (H) 4.8 - 5.6 %    Comment: (NOTE) Pre diabetes:          5.7%-6.4%  Diabetes:              >6.4%  Glycemic control for   <7.0% adults with diabetes    Mean Plasma Glucose 119.76 mg/dL    Comment: Performed at [facility], [address] [place], [place] [address]  Lipid panel     Status: Abnormal   Collection Time: 08/16/23  6:39 AM  Result Value Ref Range   Cholesterol 162 0 - 200 mg/dL   Triglycerides 098 <119 mg/dL   HDL 38 (L)  >14 mg/dL   Total CHOL/HDL Ratio 4.3 RATIO   VLDL 27 0 - 40 mg/dL   LDL Cholesterol 97 0 - 99 mg/dL    Comment:        Total Cholesterol/HDL:CHD Risk Coronary Heart Disease Risk Table                     Men   Women  1/2 Average Risk   3.4   3.3  Average Risk       5.0   4.4  2 X Average Risk   9.6   7.1  3 X Average Risk  23.4   11.0        Use the calculated Patient Ratio above and the CHD Risk Table to determine the patient's CHD Risk.        ATP III CLASSIFICATION (LDL):  <100     mg/dL   Optimal  782-956  mg/dL   Near or Above                    Optimal  130-159  mg/dL   Borderline  213-086  mg/dL   High  >578     mg/dL   Very High Performed at [facility], [address], [place], [place] [address]     Blood Alcohol level:  Lab Results  Component Value Date   ETH <10 08/08/2023   [facility]  08/25/2010    <5        LOWEST DETECTABLE LIMIT FOR SERUM ALCOHOL IS 5 mg/dL FOR MEDICAL PURPOSES ONLY    Metabolic Disorder Labs: Lab Results  Component Value Date   HGBA1C 5.8 (H) 08/15/2023   MPG 119.76 08/15/2023   No results found for: "PROLACTIN" Lab Results  Component Value Date   CHOL 162 08/16/2023   TRIG 135 08/16/2023  HDL 38 (L) 08/16/2023   CHOLHDL 4.3 08/16/2023   VLDL 27 08/16/2023   LDLCALC 97 08/16/2023    Physical Findings: AIMS:  , ,  ,  ,    CIWA:    COWS:     Musculoskeletal: Strength & Muscle Tone: within normal limits Gait & Station: normal Patient leans: N/A  Psychiatric Specialty Exam:  Presentation  General Appearance:  Casual; Appropriate for Environment  Eye Contact: Fair  Speech: Normal Rate  Speech Volume: Normal  Handedness: -- (not assessed)   Mood and Affect  Mood: Anxious  Affect: Congruent   Thought Process  Thought Processes: Linear  Descriptions of Associations:Intact  Orientation:Full (Time, Place and Person)  Thought Content:Logical  History of  Schizophrenia/Schizoaffective disorder:No data recorded Duration of Psychotic Symptoms:No data recorded Hallucinations:Hallucinations: None  Ideas of Reference:None  Suicidal Thoughts:Suicidal Thoughts: No  Homicidal Thoughts:Homicidal Thoughts: No   Sensorium  Memory: Immediate Good; Recent Fair  Judgment: Fair  Insight: Fair   Art therapist  Concentration: Fair  Attention Span: Fair  Recall: Fair  Fund of Knowledge: Good  Language: Good   Psychomotor Activity  Psychomotor Activity: Psychomotor Activity: Normal   Assets  Assets: Communication Skills; Desire for Improvement   Sleep  Sleep: Sleep: Fair    Physical Exam: Physical Exam Vitals and nursing note reviewed.  Constitutional:      Appearance: Normal appearance.  HENT:     Head: Normocephalic and atraumatic.  Eyes:     Extraocular Movements: Extraocular movements intact.  Musculoskeletal:        General: Normal range of motion.     Cervical back: Normal range of motion.  Neurological:     General: No focal deficit present.     Mental Status: She is alert and oriented to person, place, and time.  Psychiatric:        Behavior: Behavior normal.    Review of Systems  Constitutional:  Negative for fever.  Gastrointestinal:  Positive for constipation. Negative for diarrhea, nausea and vomiting.  Musculoskeletal:  Negative for myalgias.  Psychiatric/Behavioral:  Negative for hallucinations and suicidal ideas.    Blood pressure (!) 112/90, pulse 92, temperature 97.7 F (36.5 C), temperature source Oral, resp. rate 18, height 5\' 3"  (1.6 m), weight 69.9 kg, SpO2 100%. Body mass index is 27.28 kg/m.   Treatment Plan Summary: Daily contact with patient to assess and evaluate symptoms and progress in treatment, Medication management, and Plan     ASSESSMENT: Elizaveta Mattice is a 47 y.o., female with a past psychiatric history significant for GAD, MDD, tobacco use disorder, opiate  use disorder in remission, stimulant use disorder (methamphetamine) who presents to the [facility]untary from [facility] for evaluation and management of SI with overdosing on flexeril.    Diagnoses / Active Problems: MDD, recurrent, severe Stimulant use disorder (methamphetamine) Tobacco use disorder R/o cluster B personality traits   PLAN: Safety and Monitoring:             --  INVOLUNTARY admission to inpatient psychiatric unit for safety, stabilization and treatment             -- Daily contact with patient to assess and evaluate symptoms and progress in treatment             -- Patient's case to be discussed in multi-disciplinary team meeting             -- Observation Level : q15 minute checks             --  Vital signs:  q12 hours             -- Precautions: suicide, elopement, and assault   2. Psychiatric Diagnoses and Treatment:              -continue lexapro 10mg  daily for depressed mood             -continue abilify 5mg  daily for antidepressant augmentation               -- The risks/benefits/side-effects/alternatives to this medication were discussed in detail with the patient and time was given for questions. The patient consents to medication trial.              -- Metabolic profile and EKG monitoring obtained while on an atypical antipsychotic              -- Encouraged patient to participate in unit milieu and in scheduled group therapies              -- Short Term Goals: Ability to identify changes in lifestyle to reduce recurrence of condition will improve, Ability to verbalize feelings will improve, Ability to disclose and discuss suicidal ideas, Ability to demonstrate self-control will improve, Ability to identify and develop effective coping behaviors will improve, Ability to maintain clinical measurements within normal limits will improve, Compliance with prescribed medications will improve, and Ability to identify triggers  associated with substance abuse/mental health issues will improve              -- Long Term Goals: Improvement in symptoms so as ready for discharge    Other PRNS:                          -- acetaminophen 650 mg every 6 hours as needed for mild to moderate pain, fever, and headaches              -- hydroxyzine 25 mg three times a day as needed for anxiety              -- aluminum-magnesium hydroxide + simethicone 30 mL every 4 hours as needed for heartburn or indigestion              -- trazodone 50 mg at bedtime as needed for insomnia             -- As needed agitation protocol in-place              3. Medical Issues Being Addressed:    #Tobacco Use Disorder  Nicotine patch 21mg /24 hours ordered Smoking cessation encouraged   #Constipation -Continue Colace 100 mg daily -Miralax as needed for constipation -given mag citrate x1 on 08/17/23   #HTN -Continue home lisinopril-hydrochlorothiazide 10-12.5 mg daily   #UTI UA+leukocytes, nitrites -start macrobid 100mg  Q12H x5 days (10/12-10/16)   4. Discharge Planning:              -- Social work and case management to assist with discharge planning and identification of hospital follow-up needs prior to discharge             -- Estimated LOS: 5-7 days             -- Discharge Concerns: Need to establish a safety plan; Medication compliance and effectiveness             -- Discharge Goals: Return home with outpatient referrals for mental health follow-up including medication management/psychotherapy    I certify  that inpatient services furnished can reasonably be expected to improve the patient's condition.    Roselle Locus, MD 08/17/2023, 2:15 PM

## 2023-08-17 NOTE — Group Note (Signed)
Date:  08/17/2023 Time:  11:32 AM  Group Topic/Focus:  Coping With Mental Health Crisis:   The purpose of this group is to help patients identify strategies for coping with mental health crisis.  Group discusses possible causes of crisis and ways to manage them effectively.    Participation Level:  Active  Participation Quality:  Appropriate  Affect:  Appropriate  Cognitive:  Appropriate  Insight: Appropriate  Engagement in Group:  Engaged  Modes of Intervention:  Exploration  Additional Comments:     Reymundo Poll 08/17/2023, 11:32 AM

## 2023-08-17 NOTE — Group Note (Signed)
Date:  08/17/2023 Time:  10:43 AM  Group Topic/Focus:  Goals Group:   The focus of this group is to help patients establish daily goals to achieve during treatment and discuss how the patient can incorporate goal setting into their daily lives to aide in recovery.    Participation Level:  Active  Participation Quality:  Appropriate  Affect:  Appropriate  Cognitive:  Appropriate  Insight: Appropriate  Engagement in Group:  Engaged  Modes of Intervention:  Discussion  Additional Comments:     Reymundo Poll 08/17/2023, 10:43 AM

## 2023-08-17 NOTE — [facility] Group Notes (Signed)
Type of Therapy and Topic:  Group Therapy: Gratitude  Participation Level:  Minimal   Description of Group:   In this group, patients shared and discussed the importance of acknowledging the elements in their lives for which they are grateful and how this can positively impact their mood.  The group discussed how bringing the positive elements of their lives to the forefront of their minds can help with recovery from any illness, physical or mental.  An exercise was done as a group in which a list was made of gratitude items in order to encourage participants to consider other potential positives in their lives.  Therapeutic Goals: Patients will identify one or more item for which they are grateful in each of 6 categories:  people, experiences, things, places, skills, and other. Patients will discuss how it is possible to seek out gratitude in even bad situations. Patients will explore other possible items of gratitude that they could remember.   Summary of Patient Progress:  The patient shared that She is grateful for family.  Patient's reaction to the group was not responding.  Therapeutic Modalities:   Solution-Focused Therapy Activity

## 2023-08-18 DIAGNOSIS — F339 Major depressive disorder, recurrent, unspecified: Secondary | ICD-10-CM

## 2023-08-18 MED ORDER — NICOTINE 21 MG/24HR TD PT24
21.0000 mg | MEDICATED_PATCH | TRANSDERMAL | Status: DC
  Filled 2023-08-18 (×3): qty 1

## 2023-08-18 MED ORDER — ESCITALOPRAM OXALATE 10 MG PO TABS: 10.0000 mg | ORAL_TABLET | Freq: Every day | ORAL | 0 refills | Status: AC

## 2023-08-18 MED ORDER — LISINOPRIL-HYDROCHLOROTHIAZIDE 10-12.5 MG PO TABS: 1.0000 | ORAL_TABLET | Freq: Every day | ORAL | 0 refills | Status: AC

## 2023-08-18 MED ORDER — TRAZODONE HCL 50 MG PO TABS: 50.0000 mg | ORAL_TABLET | Freq: Every evening | ORAL | 0 refills | Status: AC | PRN

## 2023-08-18 MED ORDER — HYDROXYZINE HCL 25 MG PO TABS: 25.0000 mg | ORAL_TABLET | Freq: Three times a day (TID) | ORAL | 0 refills | Status: AC | PRN

## 2023-08-18 MED ORDER — ARIPIPRAZOLE 5 MG PO TABS: 5.0000 mg | ORAL_TABLET | Freq: Every day | ORAL | 0 refills | Status: AC

## 2023-08-18 MED ORDER — NITROFURANTOIN MONOHYD MACRO 100 MG PO CAPS: 100.0000 mg | ORAL_CAPSULE | Freq: Two times a day (BID) | ORAL | 0 refills | Status: AC

## 2023-08-18 MED ORDER — NICOTINE 21 MG/24HR TD PT24: 21.0000 mg | MEDICATED_PATCH | TRANSDERMAL | 0 refills | Status: AC

## 2023-08-18 NOTE — Discharge Summary (Cosign Needed Addendum)
Physician Discharge Summary Note Patient:  Christina Stokes is an 47 y.o., female MRN:  [id number] DOB:  Jul 01, 1976 Patient phone:  [phone] (home)  Patient address:   [address] [place] [address],  Total Time spent with patient: 20 minutes  Date of Admission:  08/14/2023 Date of Discharge: 08/18/2023  Reason for Admission:   SI with overdosing on flexeril   Principal Problem: Major depressive disorder, recurrent episode with mixed features [facility]) Discharge Diagnoses: Principal Problem:   Major depressive disorder, recurrent episode with mixed features (HCC) Active Problems:   Tobacco use disorder   Methamphetamine abuse, episodic (HCC)   Severe episode of recurrent major depressive disorder, without psychotic features (HCC)   UTI (urinary tract infection)   Constipation   Past Psychiatric History: Previous Psych Diagnoses: reported bipolar disorder, GAD, tobacco use disorder, stimulant use disorder Prior psychiatric treatment: unsure of medications but per chart review: seroquel, geodon, wellbutrin, naltrexone, abilify, depakote Psychiatric medication compliance history: non-compliant   Current psychiatric treatment: chantix and xanax (states she had 14 day supply and took 3 for anxiety) Current psychiatrist: has not seen in a while Current therapist: denies   Previous hospitalizations Denies History of suicide attempts: Denies History of self harm: Denies   Substance Use History: Alcohol: Denies, states used to drink many months ago Hx withdrawal tremors/shakes: Denies Hx alcohol related blackouts Denies Hx alcohol induced hallucinations: Denies Hx alcoholic seizures: Denies DUI: Denies   --------   Tobacco: reports smoking 1 pack daily since her 20's Marijuana: Denies Cocaine: Denies Methamphetamines: Yes, almost daily, for 2 years. Was previous clean for 4 months and relapsed in April. Ecstasy: Denies Opiates: Denies Benzodiazepines: Xanax  (states she had 14 day supply and took 3 for anxiety) Prescribed Meds abuse: Yes  Past Medical History:  Past Medical History:  Diagnosis Date   Anxiety    Back pain    GERD (gastroesophageal reflux disease)    MDD (major depressive disorder)    Methamphetamine abuse (HCC)    Opiate addiction (HCC)    Opioid dependence on agonist therapy (HCC)    Tobacco abuse     Past Surgical History:  Procedure Laterality Date   LEG SURGERY     TUBAL LIGATION      Family History:  Reports mom had anxiety and OCD Sister has bipolar disorder, anxiety   Social History:  Marital Status: Split with husband in September Children: 5 total kids, two live with husband and 3 are grown Employment: Denies Education: finished Careers information officer Housing: Unhoused in [employer] since split, sometimes staying with husband Legal: Denies Hotel manager: Denies Weapons: Denies Pills stockpile: Yes, daughter helping with this issue  Hospital Course:   During the patient's hospitalization, patient had extensive initial psychiatric evaluation, and follow-up psychiatric evaluations every day.  Psychiatric diagnoses provided upon initial assessment: Principal Problem:   Major depressive disorder, recurrent episode with mixed features (HCC) Active Problems:   Tobacco use disorder   Methamphetamine abuse, episodic (HCC)   Severe episode of recurrent major depressive disorder, without psychotic features (HCC)   UTI (urinary tract infection)   Constipation   The following medications were managed:  Upon admission, the following medications were changed / started / discontinued: -Started Lexapro 10 mg for depression (was given in [facility]) -Trazodone 50 mg at bedtime as needed for insomnia -Atarax 25 mg TID as needed for anxiety -Agitation Protocol: Haldol, Bendaryl, Ativan -Nicotine patch -Colace 100 mg daily for constipation -Continued home lisinopril-hydrochlorothiazide 10-12.5 mg daily   During the  patient's  stay, the following medications were changed / started / discontinued, with final adjustments by discharge: -Started and continued Abilify 5mg  daily for antidepressant augmentation -Started macrobid 100mg  Q12H x5 days (10/12-10/16) for UTI   During the hospitalization, patient had the following lab / imaging / testing abnormalities which require further evaluation / management / treatment: Hgb A1c 5.8  Patient's care was discussed during the interdisciplinary team meeting every day during the hospitalization.  The patient denies any side effects to prescribed psychiatric medication.  Christina Stokes is a 47 y.o., female with a past psychiatric history significant for GAD, MDD, tobacco use disorder, opiate use disorder in remission, stimulant use disorder (methamphetamine)who presents to the [facility]untary from [facility] Emergency Department for evaluation and management of SI with overdosing on flexeril.   Gradually, patient started adjusting to milieu. The patient was evaluated each day by a clinical provider to ascertain response to treatment. Improvement was noted by the patient's report of decreasing symptoms, improved sleep and appetite, affect, medication tolerance, behavior, and participation in unit programming.  Patient was asked each day to complete a self inventory noting mood, mental status, pain, new symptoms, anxiety and concerns.    Symptoms were reported as significantly decreased or resolved completely by discharge.   On day of discharge, the patient reports that their mood is stable. The patient denied having suicidal thoughts for more than 48 hours prior to discharge.  Patient denies having homicidal thoughts.  Patient denies having auditory hallucinations.  Patient denies any visual hallucinations or other symptoms of psychosis. The patient was motivated to continue taking medication with a goal of continued improvement in mental health.   The patient  reports their target psychiatric symptoms of depression responded well to the psychiatric medications, and the patient reports overall benefit other psychiatric hospitalization. Supportive psychotherapy was provided to the patient. The patient also participated in regular group therapy while hospitalized. Coping skills, problem solving as well as relaxation therapies were also part of the unit programming.  Labs were reviewed with the patient, and abnormal results were discussed with the patient.  The patient is able to verbalize their individual safety plan to this provider.  Behavioral Events: none  Restraints: none  # It is recommended to the patient to continue psychiatric medications as prescribed, after discharge from the hospital.    # It is recommended to the patient to follow up with your outpatient psychiatric provider and PCP.  # It was discussed with the patient, the impact of alcohol, drugs, tobacco have been there overall psychiatric and medical wellbeing, and total abstinence from substance use was recommended to the patient.  # Prescriptions provided or sent directly to preferred pharmacy at discharge. Patient agreeable to plan. Given opportunity to ask questions. Appears to feel comfortable with discharge.    # In the event of worsening symptoms, the patient is instructed to call the crisis hotline, 911 and or go to the nearest ED for appropriate evaluation and treatment of symptoms. To follow-up with primary care provider for other medical issues, concerns and or health care needs  # Patient was discharged home and then [facility] in [place] with a plan to follow up as noted below.  Physical Findings: AIMS:  , ,  ,  ,    CIWA:    COWS:     Mental Status Exam: General Appearance: appears at stated age, casually dressed and groomed   Behavior: pleasant and cooperative   Psychomotor Activity: no psychomotor  agitation or retardation noted   Eye Contact: fair  Speech:  normal amount, tone, volume and fluency    Mood: euthymic  Affect: congruent, pleasant and interactive   Thought Process: linear, goal directed, no circumstantial or tangential thought process noted, no racing thoughts or flight of ideas  Descriptions of Associations: intact  Thought Content: no bizarre content, logical and future-oriented  Hallucinations: denies AH, VH , does not appear responding to stimuli  Delusions: no paranoia, delusions of control, grandeur, ideas of reference, thought broadcasting, and magical thinking  Suicidal Thoughts: denies SI, intention, plan  Homicidal Thoughts: denies HI, intention, plan   Alertness/Orientation: alert and fully oriented   Insight: fair, improved  Judgment: fair, improved   Memory: intact   Executive Functions  Concentration: intact  Attention Span: fair  Recall: intact  Fund of Knowledge: fair    Physical Exam  General: Pleasant, well-appearing . No acute distress. Pulmonary: Normal effort. No wheezing or rales. Skin: No obvious rash or lesions. Neuro: A&Ox3.No focal deficit.   Review of Systems  No reported symptoms  Blood pressure 108/77, pulse 98, temperature 98.4 F (36.9 C), temperature source Oral, resp. rate 18, height 5\' 3"  (1.6 m), weight 69.9 kg, SpO2 100%. Body mass index is 27.28 kg/m.  Assets  Assets:Communication Skills; Desire for Improvement   Social History   Tobacco Use  Smoking Status Former   Current packs/day: 0.00   Types: Cigarettes   Quit date: 07/10/2021   Years since quitting: 2.1  Smokeless Tobacco Never   Tobacco Cessation:  A prescription for an FDA-approved tobacco cessation medication provided at discharge  Blood Alcohol level:  Lab Results  Component Value Date   ETH <10 08/08/2023   [facility]  08/25/2010    <5        LOWEST DETECTABLE LIMIT FOR SERUM ALCOHOL IS 5 mg/dL FOR MEDICAL PURPOSES ONLY    Metabolic Disorder Labs:  Lab Results  Component Value Date   HGBA1C 5.8  (H) 08/15/2023   MPG 119.76 08/15/2023   No results found for: "PROLACTIN" Lab Results  Component Value Date   CHOL 162 08/16/2023   TRIG 135 08/16/2023   HDL 38 (L) 08/16/2023   CHOLHDL 4.3 08/16/2023   VLDL 27 08/16/2023   LDLCALC 97 08/16/2023     Is patient on multiple antipsychotic therapies at discharge:  No   Has Patient had three or more failed trials of antipsychotic monotherapy by history:  No  Recommended Plan for Multiple Antipsychotic Therapies: NA  Discharge Instructions     Diet - low sodium heart healthy   Complete by: As directed    Increase activity slowly   Complete by: As directed       Allergies as of 08/18/2023       Reactions   Glucosamine Other (See Comments)   GI Intolerance   Shellfish-derived Products Nausea And Vomiting   Chantix [varenicline]    Had suicide attempt < 1 week into therapy in Oct 2024.    Codeine Itching   Shellfish Allergy Nausea And Vomiting   Tramadol Hcl Nausea Only        Medication List     STOP taking these medications    varenicline 1 MG tablet Commonly known as: CHANTIX       TAKE these medications      Indication  ARIPiprazole 5 MG tablet Commonly known as: ABILIFY Take 1 tablet (5 mg total) by mouth daily. Start taking on: August 19, 2023  Indication:  Major Depressive Disorder   escitalopram 10 MG tablet Commonly known as: LEXAPRO Take 1 tablet (10 mg total) by mouth daily. Start taking on: August 19, 2023  Indication: Major Depressive Disorder   hydrOXYzine 25 MG tablet Commonly known as: ATARAX Take 1 tablet (25 mg total) by mouth 3 (three) times daily as needed for anxiety.  Indication: Feeling Anxious   lisinopril-hydrochlorothiazide 10-12.5 MG tablet Commonly known as: ZESTORETIC Take 1 tablet by mouth daily.  Indication: High Blood Pressure   nicotine 21 mg/24hr patch Commonly known as: NICODERM CQ - dosed in mg/24 hours Place 1 patch (21 mg total) onto the skin  daily. Start taking on: August 19, 2023  Indication: Nicotine Addiction   nitrofurantoin (macrocrystal-monohydrate) 100 MG capsule Commonly known as: MACROBID Take 1 capsule (100 mg total) by mouth every 12 (twelve) hours for 5 doses.  Indication: Simple Infection of the Urinary Tract   traZODone 50 MG tablet Commonly known as: DESYREL Take 1 tablet (50 mg total) by mouth at bedtime as needed for sleep.  Indication: Trouble Sleeping        Follow-up Information     Services, [facility] Follow up.   Why: Referral made Contact information: [address] [place] [address] [phone]                 Discharge recommendations:  Please go to [facility] in [place], [place] for substance use treatment.  Activity: as tolerated  Diet: heart healthy  # It is recommended to the patient to continue psychiatric medications as prescribed, after discharge from the hospital.     # It is recommended to the patient to follow up with your outpatient psychiatric provider -instructions on appointment date, time, and address (location) are provided to you in discharge paperwork  # Follow-up with outpatient primary care doctor and other specialists -for management of chronic medical disease, including: HTN  # Testing: Follow-up with outpatient provider for abnormal lab results: A1c 5.8   # It was discussed with the patient, the impact of alcohol, drugs, tobacco have been there overall psychiatric and medical wellbeing, and total abstinence from substance use was recommended to the patient.   # Prescriptions provided or sent directly to preferred pharmacy at discharge. Patient agreeable to plan. Given opportunity to ask questions. Appears to feel comfortable with discharge.    # In the event of worsening symptoms, the patient is instructed to call the crisis hotline, 911, and or go to the nearest ED for appropriate evaluation and treatment of symptoms. To follow-up with  primary care provider for other medical issues, concerns and or health care needs  Patient agrees with D/C instructions and plan.   I discussed my assessment, planned testing and intervention for the patient with Dr. Sherron Flemings who agrees with my formulated course of action.   Signed: Lance Muss, MD, PGY-2 08/18/2023, 9:21 AM

## 2023-08-18 NOTE — Progress Notes (Signed)
  [facility] Adult Case Management Discharge Plan :  Will you be returning to the same living situation after discharge:  No. Pt will be relocating to [facility] At discharge, do you have transportation home?: Yes,  Louanne Skye [phone] ( Do you have the ability to pay for your medications: Yes,  Medicaid  Release of information consent forms completed and in the chart;  Patient's signature needed at discharge.  Patient to Follow up at:  Follow-up Information     [facility]. Schedule an appointment as soon as possible for a visit.   Contact information: [address]  [phone] [place], [place] Open 24 hours                Next level of care provider has access to [facility] Link:no  Safety Planning and Suicide Prevention discussed: D[place]hea [place] [phone] (     Has patient been referred to the Quitline?: Patient refused referral for treatment  Patient has been referred for addiction treatment: [facility] [phone] Ronnell Freshwater Delaine Hernandez, LCSW 08/18/2023, 9:42 AM

## 2023-08-18 NOTE — [facility] Group Notes (Signed)
[facility] Group Notes:  (Nursing/MHT/Case Management/Adjunct)  Date:  08/18/2023  Time:  1:58 AM  Type of Therapy:   Wrap-up group  Participation Level:  Active  Participation Quality:  Appropriate  Affect:  Appropriate  Cognitive:  Appropriate  Insight:  Appropriate  Engagement in Group:  Engaged  Modes of Intervention:  Education  Summary of Progress/Problems: Pt goal to move forward. Rated day 8/10.  Noah Delaine 08/18/2023, 1:58 AM

## 2023-08-18 NOTE — [facility] Suicide Risk Assessment (Cosign Needed Addendum)
Suicide Risk Assessment  Discharge Assessment    [facility] Discharge Suicide Risk Assessment   Principal Problem: Major depressive disorder, recurrent episode with mixed features [facility]) Discharge Diagnoses: Principal Problem:   Major depressive disorder, recurrent episode with mixed features (HCC) Active Problems:   Tobacco use disorder   Methamphetamine abuse, episodic (HCC)   Severe episode of recurrent major depressive disorder, without psychotic features (HCC)   UTI (urinary tract infection)   Constipation   Total Time spent with patient: 20 minutes  Christina Stokes is a 47 y.o., female with a past psychiatric history significant for GAD, MDD, tobacco use disorder, opiate use disorder in remission, stimulant use disorder (methamphetamine)who presents to the [facility] Involuntary from [facility] Emergency Department for evaluation and management of SI with overdosing on flexeril.   During the patient's hospitalization, patient had extensive initial psychiatric evaluation, and follow-up psychiatric evaluations every day.  Psychiatric diagnoses provided upon initial assessment:  Major depressive disorder, recurrent episode with mixed features (HCC) Active Problems:   Tobacco use disorder   Methamphetamine abuse, episodic (HCC)   Severe episode of recurrent major depressive disorder, without psychotic features (HCC)   UTI   Upon admission, the following medications were changed / started / discontinued:  -Started Lexapro 10 mg for depression (was given in [facility]) -Trazodone 50 mg at bedtime as needed for insomnia -Atarax 25 mg TID as needed for anxiety -Agitation Protocol: Haldol, Bendaryl, Ativan -Nicotine patch -Colace 100 mg daily for constipation -Continued home lisinopril-hydrochlorothiazide 10-12.5 mg daily    During the patient's stay, the following medications were changed / started / discontinued, with final adjustments by discharge: -Started and continued  Abilify 5mg  daily for antidepressant augmentation -Started macrobid 100mg  Q12H x5 days (10/12-10/16) for UTI  Patient's care was discussed during the interdisciplinary team meeting every day during the hospitalization.  The patient denies having side effects to prescribed psychiatric medication.  Gradually, patient started adjusting to milieu. The patient was evaluated each day by a clinical provider to ascertain response to treatment. Improvement was noted by the patient's report of decreasing symptoms, improved sleep and appetite, affect, medication tolerance, behavior, and participation in unit programming.  Patient was asked each day to complete a self inventory noting mood, mental status, pain, new symptoms, anxiety and concerns.    Symptoms were reported as significantly decreased or resolved completely by discharge.   On day of discharge, the patient reports that their mood is stable. The patient denied having suicidal thoughts for more than 48 hours prior to discharge.  Patient denies having homicidal thoughts.  Patient denies having auditory hallucinations.  Patient denies any visual hallucinations or other symptoms of psychosis. The patient was motivated to continue taking medication with a goal of continued improvement in mental health.   The patient reports their target psychiatric symptoms of depression responded well to the psychiatric medications, and the patient reports overall benefit other psychiatric hospitalization. Supportive psychotherapy was provided to the patient. The patient also participated in regular group therapy while hospitalized. Coping skills, problem solving as well as relaxation therapies were also part of the unit programming.  Labs were reviewed with the patient, and abnormal results were discussed with the patient.  The patient is able to verbalize their individual safety plan to this provider.  # It is recommended to the patient to continue psychiatric  medications as prescribed, after discharge from the hospital.    # It is recommended to the patient to follow up with your outpatient psychiatric  provider and PCP.  # It was discussed with the patient, the impact of alcohol, drugs, tobacco have been there overall psychiatric and medical wellbeing, and total abstinence from substance use was recommended the patient.ed.  # Prescriptions provided or sent directly to preferred pharmacy at discharge. Patient agreeable to plan. Given opportunity to ask questions. Appears to feel comfortable with discharge.    # In the event of worsening symptoms, the patient is instructed to call the crisis hotline, 911 and or go to the nearest ED for appropriate evaluation and treatment of symptoms. To follow-up with primary care provider for other medical issues, concerns and or health care needs  # Patient was discharged  home and then [facility] in [place] tomorrow AM with a plan to follow up as noted below.    Musculoskeletal: Strength & Muscle Tone: within normal limits Gait & Station: normal Patient leans: N/A  Psychiatric Specialty Exam  General Appearance: appears at stated age, casually dressed and groomed    Behavior: pleasant and cooperative    Psychomotor Activity: no psychomotor agitation or retardation noted    Eye Contact: fair  Speech: normal amount, tone, volume and fluency      Mood: euthymic  Affect: congruent, pleasant and interactive    Thought Process: linear, goal directed, no circumstantial or tangential thought process noted, no racing thoughts or flight of ideas  Descriptions of Associations: intact  Thought Content: no bizarre content, logical and future-oriented  Hallucinations: denies AH, VH , does not appear responding to stimuli  Delusions: no paranoia, delusions of control, grandeur, ideas of reference, thought broadcasting, and magical thinking  Suicidal Thoughts: denies SI, intention, plan  Homicidal Thoughts: denies HI,  intention, plan    Alertness/Orientation: alert and fully oriented    Insight: fair, improved  Judgment: fair, improved    Memory: intact    Executive Functions  Concentration: intact  Attention Span: fair  Recall: intact  Fund of Knowledge: fair      Physical Exam  General: Pleasant, well-appearing . No acute distress. Pulmonary: Normal effort. No wheezing or rales. Skin: No obvious rash or lesions. Neuro: A&Ox3.No focal deficit.     Review of Systems  No reported symptoms  Blood pressure 108/77, pulse 98, temperature 98.4 F (36.9 C), temperature source Oral, resp. rate 18, height 5\' 3"  (1.6 m), weight 69.9 kg, SpO2 100%. Body mass index is 27.28 kg/m.  Mental Status Per Nursing Assessment::   On Admission:  NA (denied SI during admission process)  Demographic Factors:  Caucasian and Low socioeconomic status Loss Factors: Loss of significant relationship Historical Factors: Impulsivity Risk Reduction Factors:   Sense of responsibility to family, Positive social support, and Positive coping skills or problem solving skills   Continued Clinical Symptoms:  Alcohol/Substance Abuse/Dependencies  Cognitive Features That Contribute To Risk:  Closed-mindedness    Suicide Risk:  Mild: There are no identifiable suicide plans, no associated intent, mild dysphoria and related symptoms, good self-control (both objective and subjective assessment), few other risk factors, and identifiable protective factors, including available and accessible social support.   Follow-up Information     Services, [facility] Follow up.   Why: Referral made Contact information: [address] [place] [address] [phone]                 Plan Of Care/Follow-up recommendations:  Please go to [facility] in [place], [place] for substance use treatment.   Activity: as tolerated   Diet: heart healthy   #  It is recommended to the patient to continue psychiatric  medications as prescribed, after discharge from the hospital.     # It is recommended to the patient to follow up with your outpatient psychiatric provider -instructions on appointment date, time, and address (location) are provided to you in discharge paperwork   # Follow-up with outpatient primary care doctor and other specialists -for management of chronic medical disease, including: HTN   # Testing: Follow-up with outpatient provider for abnormal lab results: A1c 5.8   # It was discussed with the patient, the impact of alcohol, drugs, tobacco have been there overall psychiatric and medical wellbeing, and total abstinence from substance use was recommended to the patient.   # Prescriptions provided or sent directly to preferred pharmacy at discharge. Patient agreeable to plan. Given opportunity to ask questions. Appears to feel comfortable with discharge.    # In the event of worsening symptoms, the patient is instructed to call the crisis hotline, 911, and or go to the nearest ED for appropriate evaluation and treatment of symptoms. To follow-up with primary care provider for other medical issues, concerns and or health care needs   Lance Muss, MD 08/18/2023, 9:22 AM

## 2023-08-18 NOTE — [facility] Suicide Risk Assessment (Signed)
[facility] INPATIENT:  Family/Significant Other Suicide Prevention Education  Suicide Prevention Education:  Education Completed; 08-15-2023, Mirian Capuchin [phone] (Daughter) has been identified by the patient as the family member/significant other with whom the patient will be residing, and identified as the person(s) who will aid the patient in the event of a mental health crisis (suicidal ideations/suicide attempt).  With written consent from the patient, the family member/significant other has been provided the following suicide prevention education, prior to the and/or following the discharge of the patient.  The suicide prevention education provided includes the following: Suicide risk factors Suicide prevention and interventions National Suicide Hotline telephone number [facility] assessment telephone number [facility] Emergency Assistance 911 [facility] and/or Residential Mobile Crisis Unit telephone number  Request made of family/significant other to: Remove weapons (e.g., guns, rifles, knives), all items previously/currently identified as safety concern.   Remove drugs/medications (over-the-counter, prescriptions, illicit drugs), all items previously/currently identified as a safety concern.  Doristine Section [phone] (Daughter)verbalizes understanding of the suicide prevention education information provided.  The family member/significant other agrees to remove the items of safety concern listed above.  Margarit Minshall S Louay Myrie 08/18/2023, 10:35 AM

## 2023-08-18 NOTE — Group Note (Signed)
Date:  08/18/2023 Time:  10:32 AM  Group Topic/Focus:  Goals Group:   The focus of this group is to help patients establish daily goals to achieve during treatment and discuss how the patient can incorporate goal setting into their daily lives to aide in recovery.    Participation Level:  Active  Participation Quality:  Appropriate  Affect:  Appropriate  Cognitive:  Appropriate  Insight: Appropriate  Engagement in Group:  Engaged  Modes of Intervention:  Discussion  Additional Comments:  \  Beckie Busing 08/18/2023, 10:32 AM

## 2023-08-18 NOTE — Progress Notes (Signed)
D) Pt received calm, visible, participating in milieu, and in no acute distress. Pt A & O x4. Pt denies SI, HI, A/ V H, depression, anxiety and pain at this time. A) Pt encouraged to drink fluids. Pt encouraged to come to staff with needs. Pt encouraged to attend and participate in groups. Pt encouraged to set reachable goals.  R) Pt remained safe on unit, in no acute distress, will continue to assess.   Pt requested medication for anxiety and sleep, PRN medication provided.    08/17/23 2100  Psych Admission Type (Psych Patients Only)  Admission Status Involuntary  Psychosocial Assessment  Patient Complaints None  Eye Contact Fair  Facial Expression Animated  Affect Appropriate to circumstance  Speech Logical/coherent  Interaction Assertive  Motor Activity Slow  Appearance/Hygiene Unremarkable  Behavior Characteristics Cooperative;Appropriate to situation  Mood Pleasant;Anxious  Thought Process  Coherency WDL  Content WDL  Delusions None reported or observed  Perception WDL  Hallucination None reported or observed  Judgment Limited  Confusion None  Danger to Self  Current suicidal ideation? Denies  Agreement Not to Harm Self Yes  Description of Agreement verbal  Danger to Others  Danger to Others None reported or observed

## 2023-08-18 NOTE — Progress Notes (Signed)
Patient ID: Christina Stokes, female   DOB: [birth year], 47 y.o.   MRN: [id number] Order received for patient discharge. She denies any SI HI or AV Hallucinations. NO signs of acute decompensation. Pt states she is feeling hopeful about her follow up care plan. AVS reviewed with patient as well as crisis services including crisis care. Pt verbalized understanding. Pt belongings returned and all RX and AVS given to pt. Pt escorted to lobby to care of family.

## 2023-08-18 NOTE — Plan of Care (Signed)

## 2023-08-18 NOTE — Group Note (Signed)
Date:  08/18/2023 Time:  11:33 AM  Group Topic/Focus:  Dimensions of Wellness:   The focus of this group is to introduce the topic of wellness and discuss the role each dimension of wellness plays in total health.    Participation Level:  Active  Participation Quality:  Appropriate  Affect:  Appropriate  Cognitive:  Appropriate  Insight: Good  Engagement in Group:  Engaged  Modes of Intervention:  Discussion  Additional Comments:    Beckie Busing 08/18/2023, 11:33 AM

## 2023-08-18 NOTE — Discharge Instructions (Signed)
-  Follow-up with your outpatient psychiatric provider -instructions on appointment date, time, and address (location) are provided to you in discharge paperwork.  -Take your psychiatric medications as prescribed at discharge - instructions are provided to you in the discharge paperwork As we discussed, we will print your prescriptions.   -Follow-up with outpatient primary care doctor and other specialists -for management of preventative medicine and any chronic medical disease.  -Recommend abstinence from alcohol, tobacco, and other illicit drug use at discharge.   -If your psychiatric symptoms recur, worsen, or if you have side effects to your psychiatric medications, call your outpatient psychiatric provider, 911, [phone] or go to the nearest emergency department.  -If suicidal thoughts occur, call your outpatient psychiatric provider, 911, [phone] or go to the nearest emergency department.  Naloxone (Narcan) can help reverse an overdose when given to the victim quickly.  [facility] offers free naloxone kits and instructions/training on its use.  Add naloxone to your first aid kit and you can help save a life.   Pick up your free kit at the following locations:   [place]:  [facility], [address] [place] [address] [phone]) [facility] [address] [place] [address] [phone]) [facility] [address] [place] [address]  [place]: [facility] [address] [place] [address] ([phone]) [facility] [address] [place] [address] [phone])

## 2023-08-18 NOTE — Group Note (Signed)
Recreation Therapy Group Note   Group Topic:Stress Management  Group Date: 08/18/2023 Start Time: 0935 End Time: 1001 Facilitators: Sherene Plancarte-McCall, LRT,CTRS Location: [address] Day[address]   Group Topic: Stress Management  Goal Area(s) Addresses:  Patient will identify positive stress management techniques. Patient will identify benefits of using stress management post d/c.  Group Description: Meditation. LRT played a meditation that focused on being present, living in gratitude and bringing joy into your day.     Education: Stress Management, Discharge Planning.   Education Outcome: Acknowledges Education   Affect/Mood: Appropriate   Participation Level: Active   Participation Quality: Independent   Behavior: Attentive    Speech/Thought Process: Focused   Insight: Good   Judgement: Good   Modes of Intervention: Meditation   Patient Response to Interventions:  Attentive   Education Outcome:  In group clarification offered    Clinical Observations/Individualized Feedback: Pt was attentive while eating her snack during group. Pt was appropriate during group session.     Plan: Continue to engage patient in RT group sessions 2-3x/week.   Kimarion Chery-McCall, LRT,CTRS 08/18/2023 11:55 AM

## 2023-09-08 IMAGING — DX DG CHEST 2V
2 series · 2 of 2 positions shown · non-contrast
Comparison: 04/20/2016

CLINICAL DATA: Left-sided chest and shoulder pain

EXAM:
CHEST - 2 VIEW

[chest pa]
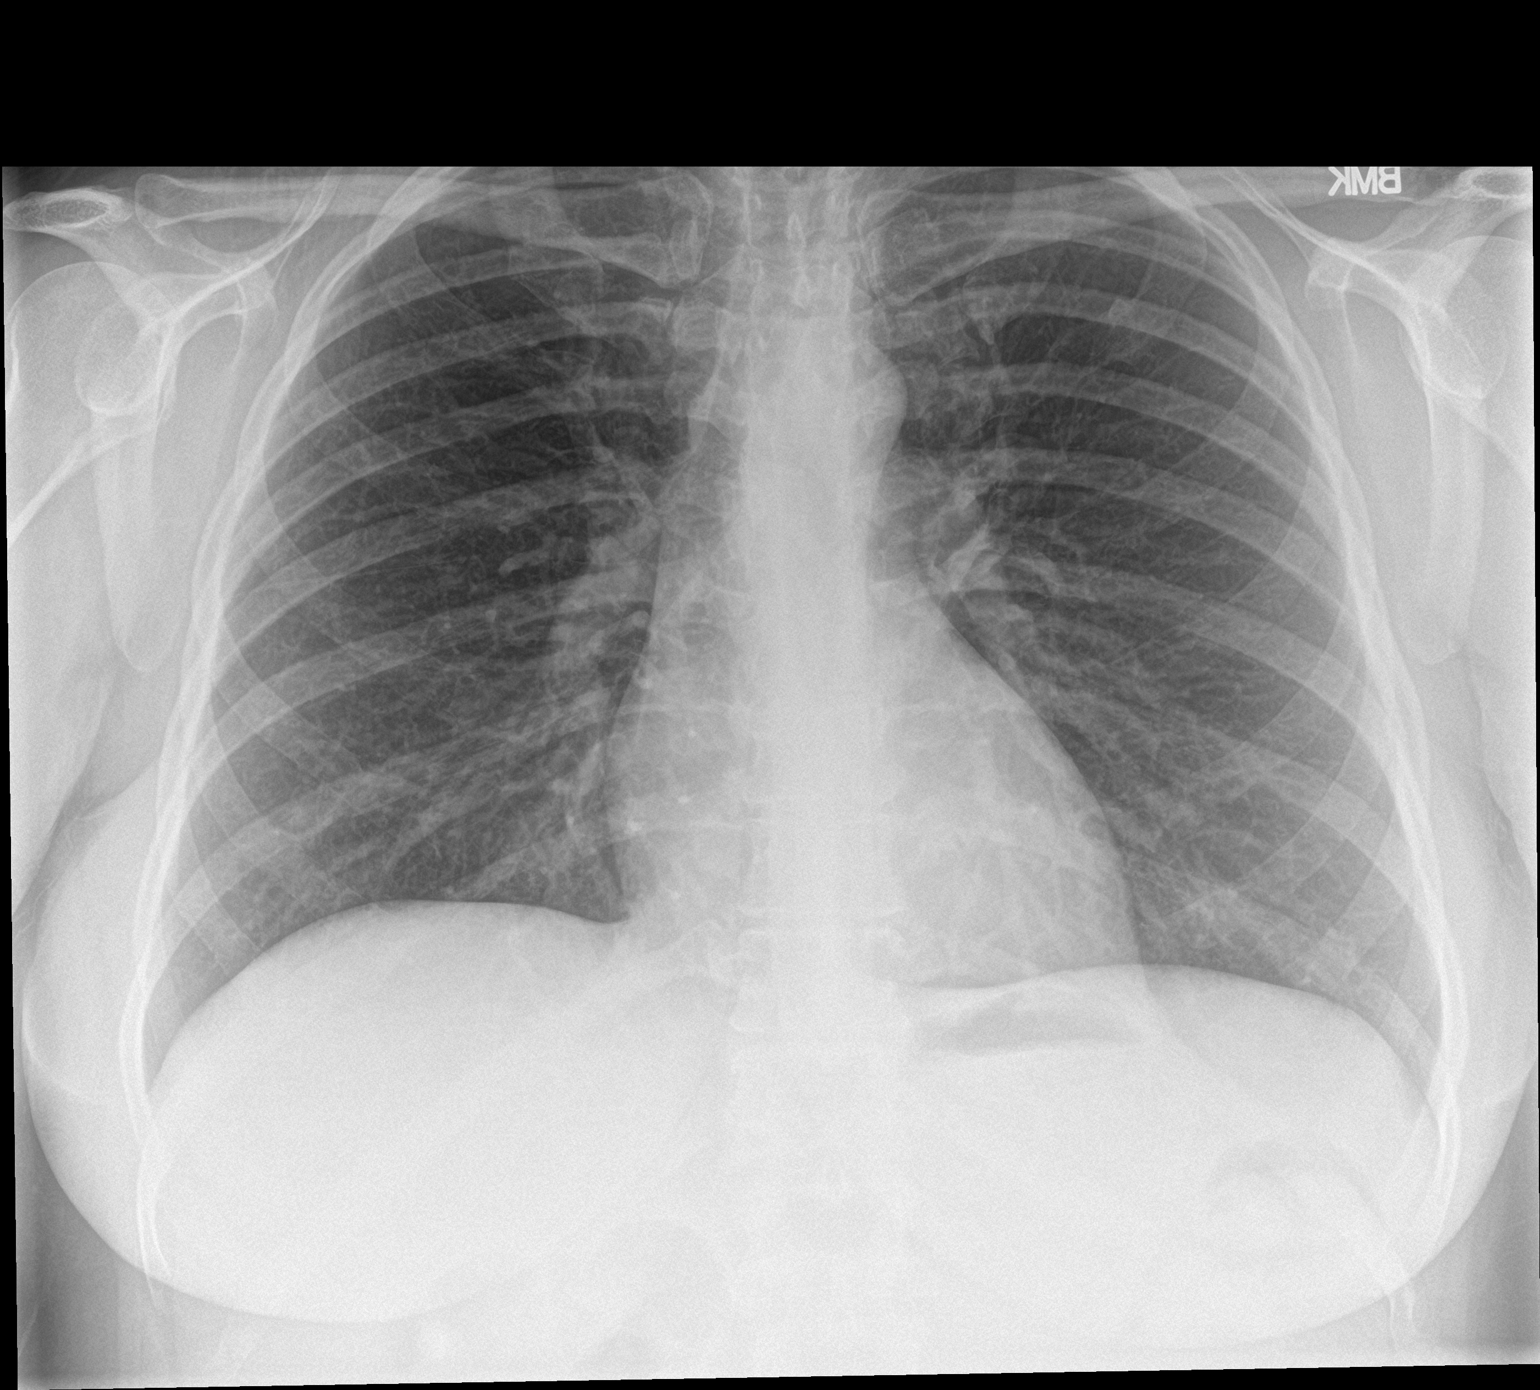

[chest lat]
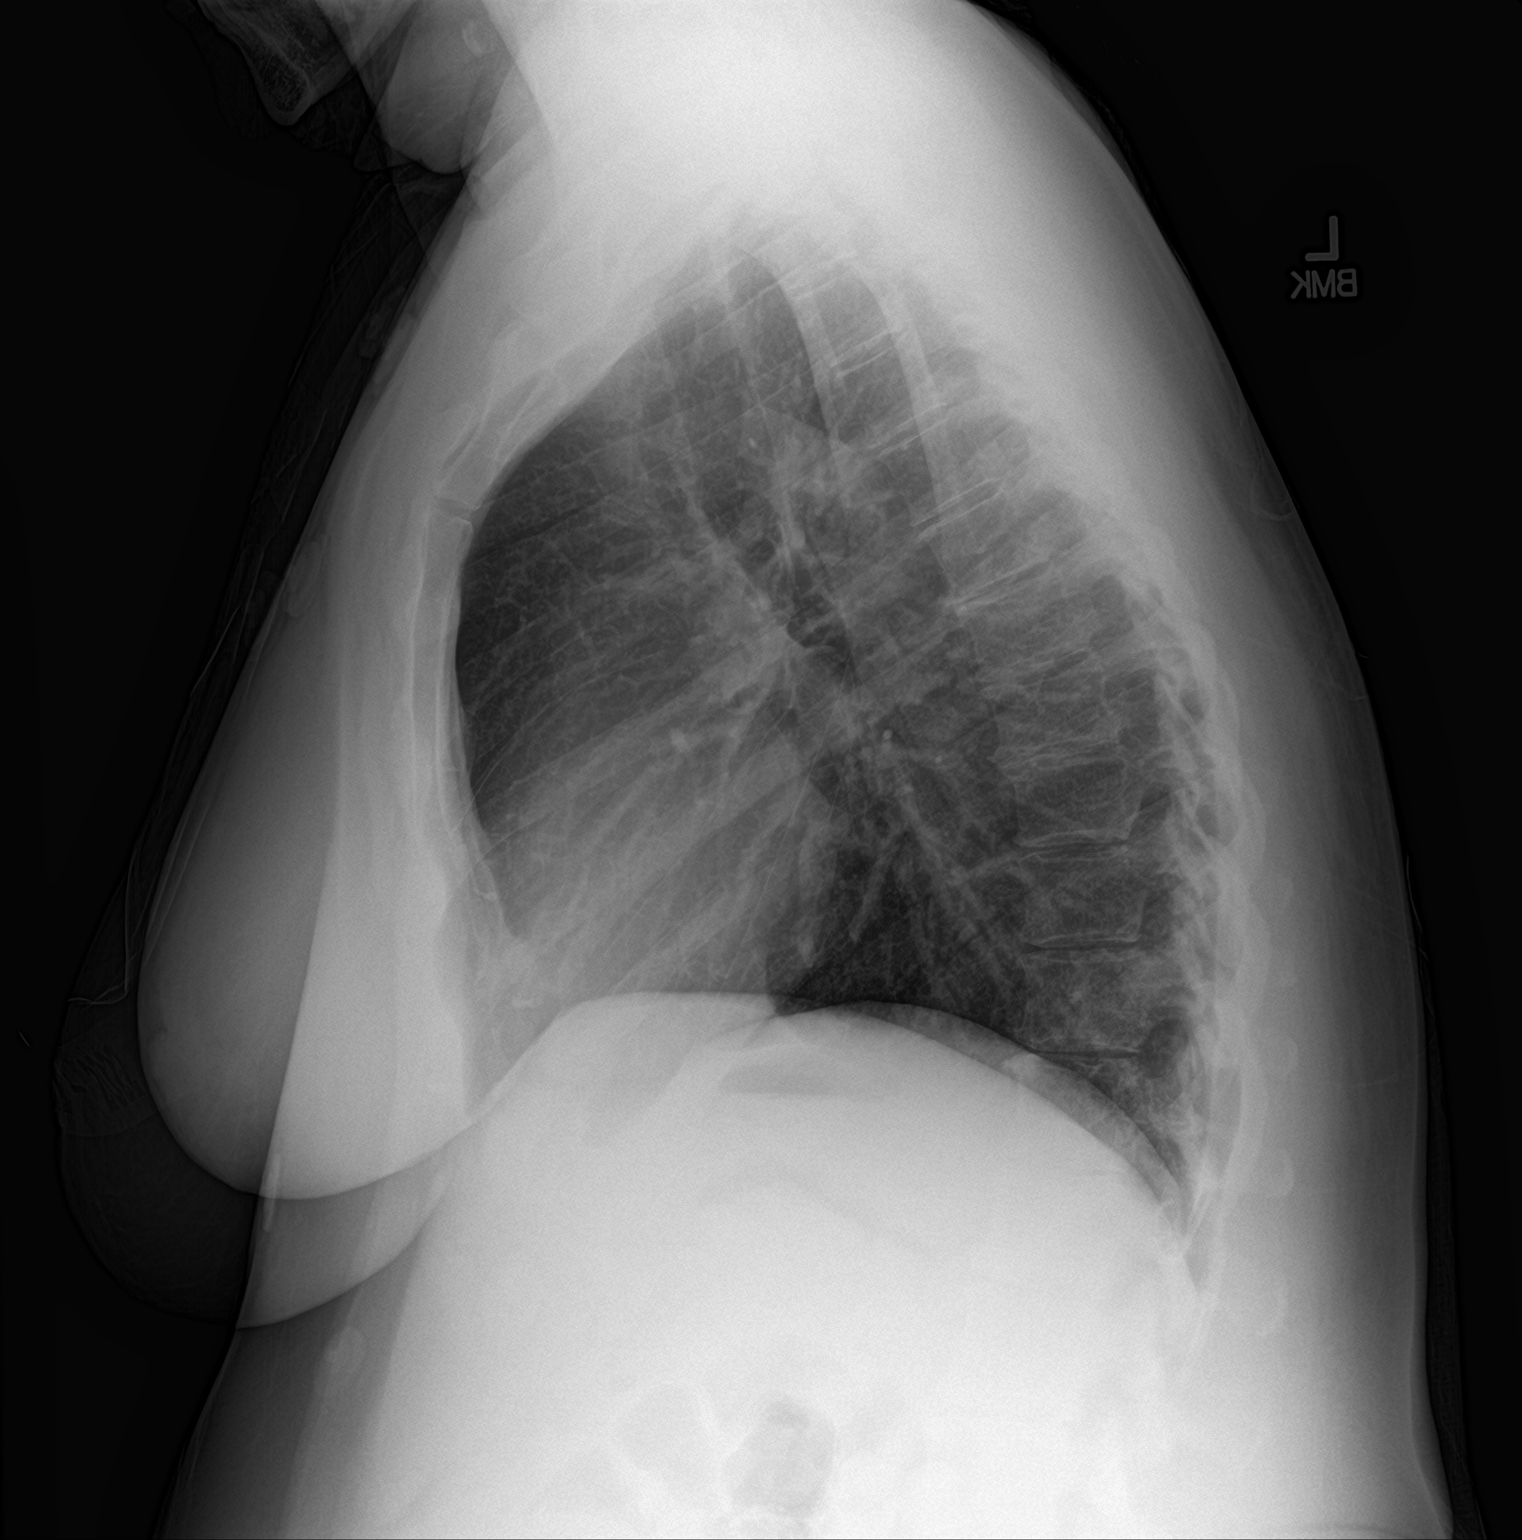

[2 of 2 positions shown; findings below may reference images not displayed]

FINDINGS: The heart size and mediastinal contours are within normal limits.
Both lungs are clear. The visualized skeletal structures are
unremarkable.
IMPRESSION: No active cardiopulmonary disease.
